# Patient Record
Sex: Male | Born: 1945 | Race: White | Hispanic: No | Marital: Married | Smoking: Former smoker
Health system: Southern US, Community
[De-identification: ages and names within clinical notes are randomized; demographics above are authoritative.]

## PROBLEM LIST (undated history)

## (undated) DIAGNOSIS — I2510 Atherosclerotic heart disease of native coronary artery without angina pectoris: Principal | ICD-10-CM

## (undated) DIAGNOSIS — I10 Essential (primary) hypertension: Secondary | ICD-10-CM

## (undated) DIAGNOSIS — E785 Hyperlipidemia, unspecified: Secondary | ICD-10-CM

## (undated) DIAGNOSIS — R0602 Shortness of breath: Secondary | ICD-10-CM

## (undated) DIAGNOSIS — R479 Unspecified speech disturbances: Secondary | ICD-10-CM

## (undated) DIAGNOSIS — M6282 Rhabdomyolysis: Secondary | ICD-10-CM

## (undated) DIAGNOSIS — M720 Palmar fascial fibromatosis [Dupuytren]: Secondary | ICD-10-CM

## (undated) HISTORY — PX: COLONOSCOPY: SHX174

## (undated) HISTORY — PX: TONSILLECTOMY: SHX5217

## (undated) HISTORY — DX: Essential (primary) hypertension: I10

## (undated) HISTORY — DX: Hyperlipidemia, unspecified: E78.5

## (undated) HISTORY — DX: Atherosclerotic heart disease of native coronary artery without angina pectoris: I25.10

## (undated) HISTORY — PX: HAND SURGERY: SHX662

## (undated) HISTORY — DX: Palmar fascial fibromatosis (dupuytren): M72.0

## (undated) HISTORY — DX: Unspecified speech disturbances: R47.9

---

## 2007-08-08 ENCOUNTER — Ambulatory Visit: Payer: Self-pay | Admitting: Internal Medicine

## 2007-08-08 LAB — CONVERTED CEMR LAB
ALT: 33 units/L (ref 0–53)
Albumin: 4.1 g/dL (ref 3.5–5.2)
Basophils Absolute: 0 10*3/uL (ref 0.0–0.1)
Bilirubin Urine: NEGATIVE
Bilirubin, Direct: 0.2 mg/dL (ref 0.0–0.3)
Calcium: 9.6 mg/dL (ref 8.4–10.5)
Eosinophils Absolute: 0.7 10*3/uL — ABNORMAL HIGH (ref 0.0–0.6)
Eosinophils Relative: 10.3 % — ABNORMAL HIGH (ref 0.0–5.0)
GFR calc Af Amer: 87 mL/min
GFR calc non Af Amer: 72 mL/min
Glucose, Bld: 100 mg/dL — ABNORMAL HIGH (ref 70–99)
HDL: 60.1 mg/dL (ref >39.0)
Hemoglobin, Urine: NEGATIVE
Ketones, ur: NEGATIVE mg/dL
Lymphocytes Relative: 25 % (ref 12.0–46.0)
MCHC: 35.4 g/dL (ref 30.0–36.0)
MCV: 100.7 fL — ABNORMAL HIGH (ref 78.0–100.0)
Monocytes Relative: 8.8 % (ref 3.0–11.0)
Neutro Abs: 3.9 10*3/uL (ref 1.4–7.7)
PSA: 0.36 ng/mL (ref 0.10–4.00)
Platelets: 243 10*3/uL (ref 150–400)
RBC: 4.41 M/uL (ref 4.22–5.81)
TSH: 1.97 microintl units/mL (ref 0.35–5.50)
Total CHOL/HDL Ratio: 4
Triglycerides: 102 mg/dL (ref 0–149)
Urine Glucose: NEGATIVE mg/dL
WBC: 7 10*3/uL (ref 4.5–10.5)

## 2007-08-11 ENCOUNTER — Ambulatory Visit: Payer: Self-pay | Admitting: Internal Medicine

## 2007-09-05 ENCOUNTER — Ambulatory Visit: Payer: Self-pay | Admitting: Internal Medicine

## 2007-09-19 ENCOUNTER — Encounter: Payer: Self-pay | Admitting: Internal Medicine

## 2007-09-19 ENCOUNTER — Ambulatory Visit: Payer: Self-pay | Admitting: Internal Medicine

## 2007-09-19 LAB — HM COLONOSCOPY: HM Colonoscopy: ABNORMAL

## 2007-10-06 ENCOUNTER — Ambulatory Visit: Payer: Self-pay | Admitting: Internal Medicine

## 2007-10-06 DIAGNOSIS — R7309 Other abnormal glucose: Secondary | ICD-10-CM

## 2007-10-06 LAB — CONVERTED CEMR LAB
ALT: 39 units/L (ref 0–53)
AST: 27 units/L (ref 0–37)
Bilirubin, Direct: 0.1 mg/dL (ref 0.0–0.3)
Cholesterol: 216 mg/dL (ref 0–200)
HDL: 45 mg/dL (ref >39.0)
Total CHOL/HDL Ratio: 4.8
Total Protein: 7.7 g/dL (ref 6.0–8.3)

## 2007-10-12 ENCOUNTER — Ambulatory Visit: Payer: Self-pay | Admitting: Internal Medicine

## 2007-10-12 DIAGNOSIS — I10 Essential (primary) hypertension: Secondary | ICD-10-CM | POA: Insufficient documentation

## 2007-10-12 DIAGNOSIS — E785 Hyperlipidemia, unspecified: Secondary | ICD-10-CM

## 2007-12-05 ENCOUNTER — Ambulatory Visit: Payer: Self-pay | Admitting: Internal Medicine

## 2007-12-05 LAB — CONVERTED CEMR LAB
ALT: 31 units/L (ref 0–53)
AST: 22 units/L (ref 0–37)
LDL Cholesterol: 82 mg/dL (ref 0–99)
VLDL: 16 mg/dL (ref 0–40)

## 2007-12-06 ENCOUNTER — Encounter: Payer: Self-pay | Admitting: Internal Medicine

## 2007-12-09 ENCOUNTER — Ambulatory Visit: Payer: Self-pay | Admitting: Internal Medicine

## 2007-12-09 DIAGNOSIS — L259 Unspecified contact dermatitis, unspecified cause: Secondary | ICD-10-CM | POA: Insufficient documentation

## 2007-12-22 ENCOUNTER — Telehealth: Payer: Self-pay | Admitting: Internal Medicine

## 2008-01-10 ENCOUNTER — Telehealth: Payer: Self-pay | Admitting: Internal Medicine

## 2008-02-06 ENCOUNTER — Ambulatory Visit: Payer: Self-pay | Admitting: Internal Medicine

## 2008-02-07 ENCOUNTER — Encounter: Payer: Self-pay | Admitting: Internal Medicine

## 2008-04-10 ENCOUNTER — Ambulatory Visit: Payer: Self-pay | Admitting: Internal Medicine

## 2008-08-14 ENCOUNTER — Telehealth: Payer: Self-pay | Admitting: Internal Medicine

## 2008-08-15 ENCOUNTER — Ambulatory Visit: Payer: Self-pay | Admitting: Internal Medicine

## 2008-08-15 DIAGNOSIS — J309 Allergic rhinitis, unspecified: Secondary | ICD-10-CM | POA: Insufficient documentation

## 2008-08-15 DIAGNOSIS — H9319 Tinnitus, unspecified ear: Secondary | ICD-10-CM | POA: Insufficient documentation

## 2008-10-04 ENCOUNTER — Ambulatory Visit: Payer: Self-pay | Admitting: Internal Medicine

## 2008-10-04 LAB — CONVERTED CEMR LAB
ALT: 31 units/L (ref 0–53)
AST: 26 units/L (ref 0–37)
CO2: 28 meq/L (ref 19–32)
Chloride: 109 meq/L (ref 96–112)
Potassium: 4.5 meq/L (ref 3.5–5.1)
Sodium: 143 meq/L (ref 135–145)
Total CHOL/HDL Ratio: 2.4

## 2008-10-09 ENCOUNTER — Ambulatory Visit: Payer: Self-pay | Admitting: Internal Medicine

## 2008-10-09 DIAGNOSIS — E669 Obesity, unspecified: Secondary | ICD-10-CM

## 2009-02-07 ENCOUNTER — Ambulatory Visit: Payer: Self-pay | Admitting: Internal Medicine

## 2009-02-07 DIAGNOSIS — M720 Palmar fascial fibromatosis [Dupuytren]: Secondary | ICD-10-CM

## 2009-04-04 ENCOUNTER — Encounter: Payer: Self-pay | Admitting: Internal Medicine

## 2009-05-01 ENCOUNTER — Telehealth: Payer: Self-pay | Admitting: Internal Medicine

## 2009-05-14 ENCOUNTER — Telehealth: Payer: Self-pay | Admitting: Internal Medicine

## 2009-07-23 ENCOUNTER — Ambulatory Visit: Payer: Self-pay | Admitting: Internal Medicine

## 2009-07-23 LAB — CONVERTED CEMR LAB
BUN: 14 mg/dL (ref 6–23)
GFR calc non Af Amer: 79.62 mL/min (ref >60)
Potassium: 4.4 meq/L (ref 3.5–5.1)
Sodium: 142 meq/L (ref 135–145)

## 2009-08-30 ENCOUNTER — Ambulatory Visit: Payer: Self-pay | Admitting: Internal Medicine

## 2009-09-06 ENCOUNTER — Ambulatory Visit: Payer: Self-pay | Admitting: Internal Medicine

## 2009-10-10 ENCOUNTER — Ambulatory Visit: Payer: Self-pay | Admitting: Internal Medicine

## 2009-10-10 DIAGNOSIS — L03019 Cellulitis of unspecified finger: Secondary | ICD-10-CM

## 2010-01-22 ENCOUNTER — Telehealth: Payer: Self-pay | Admitting: Internal Medicine

## 2010-03-05 ENCOUNTER — Encounter: Payer: Self-pay | Admitting: Internal Medicine

## 2010-03-06 ENCOUNTER — Telehealth: Payer: Self-pay | Admitting: Internal Medicine

## 2010-03-24 ENCOUNTER — Telehealth: Payer: Self-pay | Admitting: Internal Medicine

## 2010-06-03 ENCOUNTER — Telehealth: Payer: Self-pay | Admitting: Internal Medicine

## 2010-06-03 DIAGNOSIS — M79609 Pain in unspecified limb: Secondary | ICD-10-CM | POA: Insufficient documentation

## 2010-08-21 LAB — CONVERTED CEMR LAB
ALT: 46 units/L (ref 0–53)
AST: 38 units/L — ABNORMAL HIGH (ref 0–37)
Albumin: 4 g/dL (ref 3.5–5.2)
Alkaline Phosphatase: 42 units/L (ref 39–117)
BUN: 14 mg/dL (ref 6–23)
CO2: 26 meq/L (ref 19–32)
Chloride: 101 meq/L (ref 96–112)
Cholesterol: 178 mg/dL (ref 0–200)
GFR calc non Af Amer: 83.18 mL/min (ref >60.00)
Glucose, Bld: 89 mg/dL (ref 70–99)
PSA: 0.37 ng/mL (ref 0.10–4.00)
Potassium: 4.3 meq/L (ref 3.5–5.1)
Sodium: 136 meq/L (ref 135–145)
TSH: 2.4 microintl units/mL (ref 0.35–5.50)
Total Protein: 7.5 g/dL (ref 6.0–8.3)
VLDL: 12.2 mg/dL (ref 0.0–40.0)

## 2010-08-22 ENCOUNTER — Ambulatory Visit: Payer: Self-pay | Admitting: Internal Medicine

## 2010-08-29 ENCOUNTER — Encounter: Payer: Self-pay | Admitting: Internal Medicine

## 2010-08-29 ENCOUNTER — Ambulatory Visit
Admission: RE | Admit: 2010-08-29 | Discharge: 2010-08-29 | Payer: Self-pay | Source: Home / Self Care | Attending: Internal Medicine | Admitting: Internal Medicine

## 2010-09-01 ENCOUNTER — Telehealth: Payer: Self-pay | Admitting: Internal Medicine

## 2010-09-01 DIAGNOSIS — J329 Chronic sinusitis, unspecified: Secondary | ICD-10-CM | POA: Insufficient documentation

## 2010-09-23 NOTE — Progress Notes (Signed)
Summary: Simvastatin dose update  Phone Note From Pharmacy   [facility]  [address] [phone]* Summary of Call: Requesting a new rx with updated dosage Initial call taken by: Glendell Docker CMA,  March 24, 2010 11:39 AM    New/Updated Medications: SIMVASTATIN 20 MG TABS (SIMVASTATIN) Take 1 tablet by mouth once a day Prescriptions: SIMVASTATIN 20 MG TABS (SIMVASTATIN) Take 1 tablet by mouth once a day  #30 x 2   Entered by:   Glendell Docker CMA   Authorized by:   D. Thomos Lemons DO   Signed by:   Glendell Docker CMA on 03/24/2010   Method used:   [facility]        [facility]  [address] [phone]* (retail)       [address]       [place], [place]  [address]       Ph: [phone] or [phone]       Fax: [phone]   RxID:   [id number]

## 2010-09-23 NOTE — Progress Notes (Signed)
Summary: Medication Change  Phone Note Outgoing Call   Summary of Call: call pt - advise him to take 1/2 tab of simvastatin 40 mg Initial call taken by: D. Thomos Lemons DO,  March 06, 2010 5:24 PM  Follow-up for Phone Call        attempted to contact patient at [phone], no answer. A detailed voice message was left informing patient per Dr Artist Pais instructions. He was advised to call back with any questions Follow-up by: Glendell Docker CMA,  March 07, 2010 8:30 AM    New/Updated Medications: SIMVASTATIN 40 MG  TABS (SIMVASTATIN) one half tab by mouth once daily

## 2010-09-23 NOTE — Progress Notes (Signed)
Summary: Foot Dr Referral  Phone Note Call from Patient Call back at Work Phone [phone]   Caller: Patient Call For: D. Thomos Lemons DO Reason for Call: Referral Summary of Call: patient called and left voice message stating he  has developed a growth on his foot that is now causing him pain. He would like to know if there is a foot doctor that Dr Artist Pais could refer him to for evaluation Initial call taken by: Glendell Docker CMA,  June 03, 2010 4:30 PM  Follow-up for Phone Call        does he mind traveling to [place]? Follow-up by: D. Thomos Lemons DO,  June 03, 2010 5:24 PM  Additional Follow-up for Phone Call Additional follow up Details #1::        call placed to patient at [phone], he states he does not mind traveling to [place], because that is where he works. He states that he will be out of town the week of the 24th. He would like the appointment scheduled before or after that week. Additional Follow-up by: Glendell Docker CMA,  June 04, 2010 11:13 AM  New Problems: FOOT PAIN (ICD-729.5)   Additional Follow-up for Phone Call Additional follow up Details #2::    Patient scheduled   [facility]   Dr  Ralene Cork   Oct  18   pt notified Follow-up by: Darral Dash,  June 05, 2010 3:28 PM  New Problems: FOOT PAIN (ICD-729.5)

## 2010-09-23 NOTE — Progress Notes (Signed)
Summary: triamcinolone refill  Phone Note Call from Patient Call back at [phone]   Caller: Patient Summary of Call: Pt left message requesting refill on Triamcinolone cream. Pt last seen 2/11 and has f/u 08/29/10.   Mervin Kung CMA  January 22, 2010 11:42 AM   Follow-up for Phone Call        Left message on pt's voicemail that refill was completed and to call with any questions.  Nicki Guadalajara Fergerson CMA  January 22, 2010 11:45 AM     Prescriptions: TRIAMCINOLONE ACETONIDE 0.1 %  CREA (TRIAMCINOLONE ACETONIDE) apply bid  #30 grams x 3   Entered by:   Mervin Kung CMA   Authorized by:   D. Thomos Lemons DO   Signed by:   Mervin Kung CMA on 01/22/2010   Method used:   [facility]        [facility]  [address] [phone]* (retail)       [address]       [place], [place]  [address]       Ph: [phone] or [phone]       Fax: [phone]   RxID:   [id number]

## 2010-09-23 NOTE — Assessment & Plan Note (Signed)
Summary: thumb infected [place]   Vital Signs:  Patient profile:   65 year old male Weight:      251 pounds BMI:     31.07 O2 Sat:      96 % on Room air Temp:     98.3 degrees F oral Pulse rate:   70 / minute Pulse rhythm:   regular Resp:     18 per minute BP sitting:   100 / 70  (right arm) Cuff size:   large  Vitals Entered By: Glendell Docker CMA (October 10, 2009 3:48 PM)  O2 Flow:  Room air  Primary Care Provider:  D. Thomos Lemons DO  CC:  Infected left thumb.  History of Present Illness: 65 y/o c/o painful left thumb since Monday.he had a loose  piece of skin and clipped it with the nail clipper. Neosporin and bandage with  no improvement.  mild throbbing.  mild drainage.  no fever  Allergies: 1)  Ace Inhibitors  Past History:  Past Medical History: Colon polyp Hypertension Hyperlipidemia  Colonoscopy-September 19, 2007 (colon polyps x 3.  Internal hemorrhoids)   A descending colon polyp - adenomatous polyp.  No high grade dysplasia or invasive malignancy identified. 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   Skin:  left thumb nail bed red, tender and swollen.  not firm,  no fluctuance.  mild drainage   Impression & Recommendations:  Problem # 1:  PARONYCHIA, FINGER (ICD-681.02) Left thumb paronychia.  some driange already.  We can tx with abx and local care.  If symptoms get worse, he  understands we may need I & D His updated medication list for this problem includes:    Augmentin 875-125 Mg Tabs (Amoxicillin-pot clavulanate) ..... One by mouth bid  Complete Medication List: 1)  Benicar 20 Mg Tabs (Olmesartan medoxomil) .... Take 1 tablet by mouth once a day 2)  Simvastatin 40 Mg Tabs (Simvastatin) .... One by mouth once daily 3)  Triamcinolone Acetonide 0.1 % Crea (Triamcinolone acetonide) .... Apply bid 4)  Amlodipine Besylate 5 Mg Tabs (Amlodipine besylate) .... One by mouth qd 5)  Zostavax 16109 Unt/0.54ml Solr (Zoster vaccine live) .... Administer vaccine x 1 6)  Augmentin 875-125 Mg Tabs (Amoxicillin-pot clavulanate) .... One by mouth bid  Patient Instructions: 1)  Soak thumb in warm salt solution two times a day 2)  Call our office if your symptoms do not  improve or gets worse. Prescriptions: AUGMENTIN 875-125 MG TABS (AMOXICILLIN-POT CLAVULANATE) one by mouth bid  #14 x 0   Entered and Authorized by:   D. Thomos Lemons DO   Signed by:   D. Thomos Lemons DO on 10/10/2009   Method used:   [facility]ically to        [facility] [phone]* (retail)       [address]       [place],  [place]  [address]       Ph: [phone] or [phone]       Fax: [phone]   RxID:   [id number]   Current Allergies (reviewed today): ACE INHIBITORS

## 2010-09-23 NOTE — Medication Information (Signed)
Summary: Interaction with [facility]ipine & Simvastatin/[facility]  Interaction with [facility]ine & [facility]vastatin/[facility]   Imported By: Lanelle Bal 03/13/2010 07:53:26  _____________________________________________________________________  External Attachment:    Type:   Image     Comment:   External Document

## 2010-09-23 NOTE — Assessment & Plan Note (Signed)
Summary: pneumonia inj/mhf  Nurse Visit   Allergies: 1)  Ace Inhibitors  Immunizations Administered:  Pneumonia Vaccine:    Vaccine Type: Pneumovax    Site: left deltoid    Mfr: Merck    Dose: 0.5 ml    Route: IM    Given by: Glendell Docker CMA    Exp. Date: 08/08/2010    Lot #: [id number]    VIS given: 06/01/2008  Orders Added: 1)  Pneumococcal Vaccine [90732] 2)  Admin 1st Vaccine [60454]

## 2010-09-23 NOTE — Assessment & Plan Note (Signed)
Summary: FOLLOW UP/[facility]   Vital Signs:  Patient profile:   65 year old male Height:      75.5 inches Weight:      249.50 pounds BMI:     30.88 O2 Sat:      97 % on Room air Temp:     98.2 degrees F oral Pulse rate:   67 / minute Pulse rhythm:   regular Resp:     18 per minute BP sitting:   112 / 80  (right arm) Cuff size:   large  Vitals Entered By: Glendell Docker CMA (August 30, 2009 3:26 PM)  O2 Flow:  Room air  Primary Care Provider:  D. Thomos Lemons DO  CC:  Follow up disease management.  History of Present Illness: Follow up disease management  Hypertension Follow-Up      This is a 65 year old man who presents for Hypertension follow-up.  The patient denies lightheadedness, urinary frequency, and headaches.  The patient denies the following associated symptoms: chest pain.  Compliance with medications (by patient report) has been near 100%.  The patient reports that dietary compliance has been fair.  The patient reports exercising occasionally.    Hyperlipidemia Follow-Up      The patient also presents for Hyperlipidemia follow-up.  The patient denies muscle aches and abdominal pain.  The patient denies the following symptoms: chest pain/pressure.  Compliance with medications (by patient report) has been near 100%.  Dietary compliance has been fair.    Preventive Screening-Counseling & Management  Alcohol-Tobacco     Smoking Status: quit  Allergies: 1)  Ace Inhibitors  Past History:  Past Medical History: Colon polyp Hypertension Hyperlipidemia  Colonoscopy-September 19, 2007 (colon polyps x 3.  Internal hemorrhoids)   A descending colon polyp - adenomatous polyp.  No high grade dysplasia or invasive malignancy identified. 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   Neck:  supple and no masses.   Lungs:  normal respiratory effort and normal breath sounds.   Heart:  normal rate, regular rhythm, and no gallop.   Abdomen:  soft and non-tender.   Extremities:  No lower extremity edema  Neurologic:  cranial nerves II-XII intact and gait normal.     Impression & Recommendations:  Problem # 1:  HYPERTENSION (ICD-401.9) well controlled.  Maintain current medication regimen.  His updated medication list for this problem includes:    Benicar 20 Mg Tabs (Olmesartan medoxomil) .Marland Kitchen... Take 1 tablet by mouth once a day    Amlodipine Besylate 5 Mg Tabs (Amlodipine besylate) ..... One by mouth qd  BP today: 112/80 Prior BP: 120/60 (02/07/2009)  Labs Reviewed: K+: 4.4 (07/23/2009) Creat: : 1.0 (07/23/2009)   Chol: 135 (10/04/2008)   HDL: 56.4 (10/04/2008)   LDL: 68 (10/04/2008)   TG: 53 (10/04/2008)  Problem # 2:  HYPERLIPIDEMIA (ICD-272.4) stable.  Maintain current medication regimen.  His updated medication list  for this problem includes:    Simvastatin 40 Mg Tabs (Simvastatin) ..... One by mouth once daily  Labs Reviewed: SGOT: 26 (10/04/2008)   SGPT: 31 (10/04/2008)   HDL:56.4 (10/04/2008), 52.8 (12/05/2007)  LDL:68 (10/04/2008), 82 (12/05/2007)  Chol:135 (10/04/2008), 151 (12/05/2007)  Trig:53 (10/04/2008), 82 (12/05/2007)  Complete Medication List: 1)  Benicar 20 Mg Tabs (Olmesartan medoxomil) .... Take 1 tablet by mouth once a day 2)  Simvastatin 40 Mg Tabs (Simvastatin) .... One by mouth once daily 3)  Triamcinolone Acetonide 0.1 % Crea (Triamcinolone  acetonide) .... Apply bid 4)  Amlodipine Besylate 5 Mg Tabs (Amlodipine besylate) .... One by mouth qd 5)  Zostavax 16109 Unt/0.74ml Solr (Zoster vaccine live) .... Administer vaccine x 1  Patient Instructions: 1)  Please schedule a follow-up appointment in 1 year. 2)  BMP prior to visit, ICD-9:  401.9 3)  Hepatic Panel prior to visit, ICD-9: 272.4 4)  Lipid Panel prior to visit, ICD-9: 272.4 5)  TSH prior to visit, ICD-9: 272.4 6)  PSA:  v76.44 7)  Please return for lab work one (1) week before your next appointment.  Prescriptions: ZOSTAVAX 60454 UNT/0.65ML SOLR (ZOSTER VACCINE LIVE) administer vaccine x 1  #1 x 0   Entered and Authorized by:   D. Thomos Lemons DO   Signed by:   D. Thomos Lemons DO on 08/30/2009   Method used:   Print then Give to Patient   RxID:   [id number] AMLODIPINE BESYLATE 5 MG  TABS (AMLODIPINE BESYLATE) one by mouth qd  #90 x 3   Entered and Authorized by:   D. Thomos Lemons DO   Signed by:   D. Thomos Lemons DO on 08/30/2009   Method used:   [facility]ronically to        [facility] [phone]* (retail)       [address]       [place], [place]  [address]       Ph: [phone] or [phone]       Fax: [phone]   RxID:   [id number] SIMVASTATIN 40 MG  TABS (SIMVASTATIN) one by mouth once daily  #90 x 3   Entered and Authorized by:   D. Thomos Lemons DO   Signed by:   D. Thomos Lemons DO on 08/30/2009   Method used:   [facility]ronically to        [facility] #[id number]* (retail)       [address]       [place], [place]  [address]       Ph: [phone] or [phone]       Fax: [phone]   RxID:   [id number] BENICAR 20 MG  TABS (OLMESARTAN MEDOXOMIL) Take 1 tablet by mouth once a day  #90 x 3   Entered and Authorized by:   D. Thomos Lemons DO   Signed by:   D. Thomos Lemons DO on 08/30/2009   Method used:   [facility]ronically to        [facility] [phone]* (retail)       [address]       [place], [place]  [address]       Ph: [phone] or  [phone]       Fax: [phone]   RxID:   [id number]   Current Allergies (reviewed today): ACE INHIBITORS

## 2010-09-25 NOTE — Assessment & Plan Note (Signed)
Summary: 1 year follow up/mhf   Vital Signs:  Patient profile:   65 year old male Height:      75.5 inches Weight:      253.75 pounds BMI:     31.41 O2 Sat:      95 % on Room air Temp:     98.6 degrees F oral Pulse rate:   66 / minute Resp:     18 per minute BP sitting:   110 / 60  (left arm) Cuff size:   large  Vitals Entered By: Glendell Docker CMA (August 29, 2010 3:03 PM)  O2 Flow:  Room air   Primary Care Provider:  D. Thomos Lemons DO   History of Present Illness:  65 y/o white male with hx of htn and hyperlipidemia for routine cpx   FLU VAX          Every 12 months         07/12/2007  given      Due Now  PNEUMOVAX        At Age 13 years         09/06/2009  Pneumovax  Done  TD BOOSTER       Every 10 years          12/04/2002  given      Due On: 12/03/2012  ZOSTAVAX         At Age 57 years                                Due Now  PSA              Every 12 months         08/21/2010  0.37       Due On: 08/22/2011   or PSA OFFERED                                        COLONOSCOPY      Every 10 years          09/19/2007  abnormal   Due On: 09/18/2017  COLONNXTDUE      At Age 62 years                                Due Now  FLEX SIGMOID     Every 5 years                                  Due Now  HEMOCCULT        Every 12 months                                Due Now  CHOLESTEROL      Every 5 years           08/21/2010  178        Due On: 08/22/2015  Preventive Screening-Counseling & Management  Alcohol-Tobacco     Alcohol drinks/day: 1     Smoking Status: quit  Caffeine-Diet-Exercise     Caffeine use/day: 2 beverages daily     Does Patient Exercise: no  Allergies: 1)  Ace Inhibitors  Past History:  Past Medical History: Colon polyp Hypertension Hyperlipidemia    Colonoscopy-September 19, 2007 (colon polyps x 3.  Internal hemorrhoids)   A descending colon polyp - adenomatous polyp.  No high grade dysplasia or invasive malignancy identified. 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    Physical Exam  General:  alert, well-developed, and well-nourished.   Head:  normocephalic and atraumatic.   Mouth:  good dentition and pharynx pink and moist.   Neck:  supple and no masses.  no carotid bruits.   Lungs:  normal respiratory effort and normal breath sounds.   Heart:  normal rate, regular rhythm, and no gallop.   Extremities:  No lower extremity edema  Neurologic:  cranial nerves II-XII intact and gait normal.     Impression & Recommendations:  Problem # 1:  ROUTINE GENERAL MEDICAL EXAM@HEALTH  CARE FACL (ICD-V70.0) Reviewed adult health maintenance protocols. Pt counseled on diet and exercise.  Orders: EKG w/ Interpretation (93000)  Colonoscopy: abnormal (09/19/2007) Td Booster: given (12/04/2002)   Flu Vax: Historical (07/08/2010)   Pneumovax: Pneumovax (09/06/2009) Chol: 178 (08/21/2010)   HDL: 68.90 (08/21/2010)   LDL: 97 (08/21/2010)   TG: 61.0 (08/21/2010) TSH: 2.40 (08/21/2010)   HgbA1C: 5.3 (10/06/2007)   PSA: 0.37 (08/21/2010)  Problem # 2:  HYPERTENSION (ICD-401.9) Assessment: Unchanged  His updated medication list for this problem includes:    Benicar 20 Mg Tabs (Olmesartan medoxomil)  .Marland Kitchen... Take 1 tablet by mouth once a day    Amlodipine Besylate 5 Mg Tabs (Amlodipine besylate) ..... One by mouth qd  BP today: 110/60 Prior BP: 100/70 (10/10/2009)  Labs Reviewed: K+: 4.3 (08/21/2010) Creat: : 1.0 (08/21/2010)   Chol: 178 (08/21/2010)   HDL: 68.90 (08/21/2010)   LDL: 97 (08/21/2010)   TG: 61.0 (08/21/2010)  Problem # 3:  HYPERLIPIDEMIA (ICD-272.4) Assessment: Unchanged  His updated medication list for this problem includes:    Simvastatin 20 Mg Tabs (Simvastatin) .Marland Kitchen... Take 1 tablet by mouth once a day  Labs Reviewed: SGOT: 38 (08/21/2010)   SGPT: 46 (08/21/2010)   HDL:68.90 (08/21/2010), 56.4 (10/04/2008)  LDL:97 (08/21/2010), 68 (10/04/2008)  Chol:178 (08/21/2010), 135 (10/04/2008)  Trig:61.0 (08/21/2010), 53 (10/04/2008)  Complete Medication List: 1)  Benicar 20 Mg Tabs (Olmesartan medoxomil) .... Take 1 tablet by mouth once a day 2)  Simvastatin 20 Mg Tabs (Simvastatin) .... Take 1 tablet by mouth once a day 3)  Amlodipine Besylate 5 Mg Tabs (Amlodipine besylate) .... One by mouth qd 4)  Zostavax 11914 Unt/0.53ml Solr (Zoster vaccine live) .... Administer vaccine x 1 5)  Clotrimazole-betamethasone 1-0.05 % Crea (Clotrimazole-betamethasone) .... Apply two times a day x 1-2 weeks  Patient Instructions: 1)  Please  schedule a follow-up appointment in 1 year. Prescriptions: AMLODIPINE BESYLATE 5 MG  TABS (AMLODIPINE BESYLATE) one by mouth qd  #90 x 3   Entered and Authorized by:   D. Thomos Lemons DO   Signed by:   D. Thomos Lemons DO on 08/29/2010   Method used:   [facility] to        [facility] [phone]* (retail)       [address]       [place], [place]  [address]       Ph: [phone] or [phone]       Fax: [phone]   RxID:   [id number] SIMVASTATIN 20 MG TABS (SIMVASTATIN) Take 1 tablet by mouth once a day  #90 x 3   Entered and Authorized by:   D. Thomos Lemons DO   Signed by:   D. Thomos Lemons DO on 08/29/2010   Method used:    [facility]ronically to        [facility] #[id number] (retail)       [address]       [place], [place]  [address]       Ph: [phone] or [phone]       Fax: [phone]   RxID:   [id number] BENICAR 20 MG  TABS (OLMESARTAN MEDOXOMIL) Take 1 tablet by mouth once a day  #90 x 3   Entered and Authorized by:   D. Thomos Lemons DO   Signed by:   D. Thomos Lemons DO on 08/29/2010   Method used:   [facility]ronically to        [facility] [phone]* (retail)       [address]       [place], [place]  [address]       Ph: [phone] or [phone]       Fax: [phone]   RxID:   [id number]    Orders Added: 1)  EKG w/ Interpretation [93000] 2)  Est. Patient age 32&> [93810] 3)  Est. Patient Level II [17510]   Immunization History:  Influenza Immunization History:    Influenza:  historical (07/08/2010)   Immunization History:  Influenza Immunization History:    Influenza:  Historical (07/08/2010)   Current Allergies (reviewed today): ACE INHIBITORS

## 2010-09-25 NOTE — Progress Notes (Signed)
Summary: Dry patch & ENT  Phone Note Call from Patient Call back at Work Phone [phone]   Caller: Patient Call For: D. Thomos Lemons DO Summary of Call: patient called and left voice message stating after his office visit last week with Dr Artist Pais, he  states that he found a dry scaly patch underneath the his right leg where his socks are. He aslo wanted to know if Dr Artist Pais would provide him a referral or a name of ENT to manage his chronic  sinus problems Initial call taken by: Glendell Docker CMA,  September 01, 2010 11:55 AM  Follow-up for Phone Call        use lotrisone crm as directed if dry patch gets worse, needs OV  can you please ask pt to elaborate re:  sinus problems Follow-up by: D. Thomos Lemons DO,  September 01, 2010 5:22 PM  Additional Follow-up for Phone Call Additional follow up Details #1::        call placed to patient he has been informed per Dr Artist Pais instructions  When asked about his sinus problems, he states onset of problems at age 40 severe sinus infections, had shot of cortisone, did not have any problesm for about 10-12 years.  Move to [place]- seen a ENT there , had problems  and moved to [place] seen ENT diagnosed with sinusitis. He state that his sinuses are giving him problems. He is experiencing severe sinus problems. He feels like has become and expert regarding his sinuses.  Additional Follow-up by: Glendell Docker CMA,  September 02, 2010 9:36 AM  New Problems: SINUSITIS, CHRONIC (ICD-473.9)   Additional Follow-up for Phone Call Additional follow up Details #2::    call returned to patient at [phone], he has been advised of ENT referral Follow-up by: Glendell Docker CMA,  September 03, 2010 8:38 AM  New Problems: SINUSITIS, CHRONIC (ICD-473.9) New/Updated Medications: CLOTRIMAZOLE-BETAMETHASONE 1-0.05 % CREA (CLOTRIMAZOLE-BETAMETHASONE) apply two times a day x 1-2 weeks Prescriptions: CLOTRIMAZOLE-BETAMETHASONE 1-0.05 % CREA (CLOTRIMAZOLE-BETAMETHASONE) apply two  times a day x 1-2 weeks  #30 grams x 1   Entered and Authorized by:   D. Thomos Lemons DO   Signed by:   D. Thomos Lemons DO on 09/01/2010   Method used:   [facility] to        [facility] [phone]* (retail)       [address]       [place], [place]  [address]       Ph: [phone] or [phone]       Fax: [phone]   RxID:   [id number]

## 2011-03-19 ENCOUNTER — Encounter: Payer: Self-pay | Admitting: Internal Medicine

## 2011-03-19 ENCOUNTER — Ambulatory Visit (INDEPENDENT_AMBULATORY_CARE_PROVIDER_SITE_OTHER): Payer: 59 | Admitting: Internal Medicine

## 2011-03-19 VITALS — BP 120/70 | HR 72 | Temp 98.9°F | Resp 18 | Ht 75.5 in | Wt 254.0 lb

## 2011-03-19 DIAGNOSIS — R21 Rash and other nonspecific skin eruption: Secondary | ICD-10-CM

## 2011-03-19 MED ORDER — TRIAMCINOLONE ACETONIDE 0.025 % EX OINT: TOPICAL_OINTMENT | Freq: Two times a day (BID) | CUTANEOUS | Status: DC

## 2011-03-20 ENCOUNTER — Telehealth: Payer: Self-pay | Admitting: *Deleted

## 2011-03-20 NOTE — Telephone Encounter (Signed)
Call placed to patient at [phone], no answer. A detailed voice message was left informing patient per Dr Rodena Medin instruction. Message was left advising patient to call back if no improvement after trying standard dose.

## 2011-03-20 NOTE — Telephone Encounter (Signed)
Patient called and left voice message stating he received a rx for Triamcinolone cream 0.025 % and in the past was given a rx for  0.1%. He would like to know if the lower dose cream is going to take care of his skin irritation, or if he should get the rx for the higher dose. Please advise

## 2011-03-20 NOTE — Telephone Encounter (Signed)
Try the standard dose first.

## 2011-03-21 DIAGNOSIS — R21 Rash and other nonspecific skin eruption: Secondary | ICD-10-CM | POA: Insufficient documentation

## 2011-03-21 NOTE — Assessment & Plan Note (Signed)
Begin triamcinolone cream to affected area twice a day. Followup if no improvement or worsening

## 2011-03-21 NOTE — Progress Notes (Signed)
  Subjective:    Patient ID: Austin Keller, male    DOB: Feb 09, 1946, 65 y.o.   MRN: [id number]  HPI patient presents to clinic for evaluation of rash. Meds four-day history of right shoulder rash without pain or dermatomal distribution. Involved right upper neck and right upper chest. No specific trigger and denies itching. There is no spread. No ocular or oral involvement. Has attempted approximately 36 hours of Lotrisone cream without improvement. No other complaints.  Reviewed past medical history, medications, and allergies  Review of Systems see hpi     Objective:   Physical Exam  Nursing note and vitals reviewed. Constitutional: He appears well-developed and well-nourished. No distress.  HENT:  Head: Normocephalic and atraumatic.  Right Ear: External ear normal.  Left Ear: External ear normal.  Eyes: Conjunctivae are normal. No scleral icterus.  Neurological: He is alert.  Skin: Skin is warm and dry. He is not diaphoretic.       Erythematous vesicular papular rash involving right upper neck and right upper chest. No drainage. No obvious secondary infection. No dermatomal distribution.  Psychiatric: He has a normal mood and affect.          Assessment & Plan:   No problem-specific assessment & plan notes found for this encounter.

## 2011-03-25 ENCOUNTER — Other Ambulatory Visit: Payer: Self-pay | Admitting: Internal Medicine

## 2011-03-25 ENCOUNTER — Telehealth: Payer: Self-pay | Admitting: *Deleted

## 2011-03-25 DIAGNOSIS — R21 Rash and other nonspecific skin eruption: Secondary | ICD-10-CM

## 2011-03-25 NOTE — Telephone Encounter (Signed)
Patient called and left voice message stating he was seen last week for a rash. His message states there has been little improvement in the rash. He would llike to know if he could be referred to a dermatologist for evaluation.

## 2011-03-25 NOTE — Telephone Encounter (Signed)
Referral order placed.

## 2011-03-25 NOTE — Telephone Encounter (Signed)
Call placed [phone], he was informed per Dr. Rodena Medin instruction

## 2011-08-21 ENCOUNTER — Other Ambulatory Visit: Payer: 59

## 2011-08-21 ENCOUNTER — Ambulatory Visit: Payer: 59

## 2011-08-21 DIAGNOSIS — Z0389 Encounter for observation for other suspected diseases and conditions ruled out: Secondary | ICD-10-CM

## 2011-08-21 DIAGNOSIS — Z0000 Encounter for general adult medical examination without abnormal findings: Secondary | ICD-10-CM

## 2011-08-21 LAB — URINALYSIS
Hgb urine dipstick: NEGATIVE
Nitrite: NEGATIVE
Urobilinogen, UA: 0.2 (ref 0.0–1.0)

## 2011-08-21 LAB — CBC WITH DIFFERENTIAL/PLATELET
Eosinophils Relative: 17 % — ABNORMAL HIGH (ref 0.0–5.0)
MCV: 103.5 fl — ABNORMAL HIGH (ref 78.0–100.0)
Monocytes Absolute: 0.6 10*3/uL (ref 0.1–1.0)
Neutrophils Relative %: 48 % (ref 43.0–77.0)
Platelets: 223 10*3/uL (ref 150.0–400.0)
WBC: 6.3 10*3/uL (ref 4.5–10.5)

## 2011-08-21 LAB — HEPATIC FUNCTION PANEL
Alkaline Phosphatase: 43 U/L (ref 39–117)
Bilirubin, Direct: 0.1 mg/dL (ref 0.0–0.3)
Total Bilirubin: 0.6 mg/dL (ref 0.3–1.2)
Total Protein: 7.2 g/dL (ref 6.0–8.3)

## 2011-08-21 LAB — BASIC METABOLIC PANEL
CO2: 27 mEq/L (ref 19–32)
Calcium: 9.4 mg/dL (ref 8.4–10.5)
GFR: 83.94 mL/min (ref >60.00)
Sodium: 141 mEq/L (ref 135–145)

## 2011-08-21 LAB — LDL CHOLESTEROL, DIRECT: Direct LDL: 100.7 mg/dL

## 2011-08-21 LAB — LIPID PANEL: VLDL: 28.4 mg/dL (ref 0.0–40.0)

## 2011-08-24 ENCOUNTER — Ambulatory Visit: Payer: 59

## 2011-08-24 DIAGNOSIS — Z0000 Encounter for general adult medical examination without abnormal findings: Secondary | ICD-10-CM

## 2011-08-24 LAB — FOLATE: Folate: 9.6 ng/mL

## 2011-08-25 DIAGNOSIS — M720 Palmar fascial fibromatosis [Dupuytren]: Secondary | ICD-10-CM

## 2011-08-25 HISTORY — DX: Palmar fascial fibromatosis (dupuytren): M72.0

## 2011-08-28 ENCOUNTER — Ambulatory Visit (INDEPENDENT_AMBULATORY_CARE_PROVIDER_SITE_OTHER): Payer: 59 | Admitting: Internal Medicine

## 2011-08-28 ENCOUNTER — Encounter: Payer: Self-pay | Admitting: Internal Medicine

## 2011-08-28 ENCOUNTER — Telehealth: Payer: Self-pay | Admitting: Internal Medicine

## 2011-08-28 VITALS — BP 104/68 | HR 88 | Temp 98.4°F | Ht 74.25 in | Wt 256.0 lb

## 2011-08-28 DIAGNOSIS — Z0000 Encounter for general adult medical examination without abnormal findings: Secondary | ICD-10-CM | POA: Insufficient documentation

## 2011-08-28 DIAGNOSIS — E538 Deficiency of other specified B group vitamins: Secondary | ICD-10-CM

## 2011-08-28 DIAGNOSIS — Z2911 Encounter for prophylactic immunotherapy for respiratory syncytial virus (RSV): Secondary | ICD-10-CM

## 2011-08-28 MED ORDER — SIMVASTATIN 20 MG PO TABS: 20.0000 mg | ORAL_TABLET | Freq: Every day | ORAL | Status: DC

## 2011-08-28 MED ORDER — AMLODIPINE BESYLATE 5 MG PO TABS: 5.0000 mg | ORAL_TABLET | Freq: Every day | ORAL | Status: DC

## 2011-08-28 MED ORDER — OLMESARTAN MEDOXOMIL 20 MG PO TABS: 20.0000 mg | ORAL_TABLET | Freq: Every day | ORAL | Status: DC

## 2011-08-28 NOTE — Patient Instructions (Addendum)
Please complete the following lab tests within 1 month: CBCD, B12 level, anti parietal cell antibody, anti intrinsic factor antibody - 266.2

## 2011-08-28 NOTE — Assessment & Plan Note (Signed)
Reviewed adult health maintenance protocols. Pt updated with shingles vaccine. I encouraged weight loss and regular exercise.

## 2011-08-28 NOTE — Progress Notes (Signed)
Subjective:    Patient ID: Austin Keller, male    DOB: [birth year], 66 y.o.   MRN: [id number]  HPI  66 year old white male with history of hypertension for routine physical. He denies significant interval medical history. He is scheduled for left hand surgery to correct his dupuytren's contracture.  He is considering retirement. He stays fairly active.  He still likes to golf.  We reviewed his recent blood test results. Patient has worsening macrocytosis. B12 was decreased at 152. He denies paresthesias or unusual memory loss.  Review of Systems     Constitutional: Negative for activity change, appetite change and unexpected weight change.  Eyes: Negative for visual disturbance.  Respiratory: Negative for cough, chest tightness and shortness of breath.   Cardiovascular: Negative for chest pain.  Genitourinary: Negative for difficulty urinating.  Neurological: Negative for headaches.  Gastrointestinal: Negative for abdominal pain, heartburn melena or hematochezia Psych: Negative for depression or anxiety     Past Medical History  Diagnosis Date  . Polyp of colon   . Hypertension   . Hyperlipidemia   . Allergic rhinitis   . Dupuytren's contracture of hand     left  . Speech disorder     History   Social History  . Marital Status: Married    Spouse Name: N/A    Number of Children: 3  . 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 Smoking status: Former Smoker    Quit date: 08/24/2000  . Smokeless tobacco: Not on file   Comment: 25 pack year history  . Alcohol Use: 0.0 oz/week    14-21 drink(s) per week     2-3 drinks per day  . Drug Use:   . Sexually Active:    Other Topics Concern  . Not on file   Social History Narrative   Wife is 7 yrs younger- works in [employer]    Past Surgical History  Procedure Date  . Tonsillectomy   . Left hand surgery     for Dupuytrens contracture     Family History  Problem Relation Age of Onset  . Hip fracture Mother   . Ulcers Father     gastric    Allergies  Allergen Reactions  . Ace Inhibitors     REACTION: cough    Current Outpatient Prescriptions on File Prior to Visit  Medication Sig Dispense Refill  . amLODipine (NORVASC) 5 MG tablet Take 5 mg by mouth daily.        Marland Kitchen olmesartan (BENICAR) 20 MG tablet Take 20 mg by mouth daily.        . simvastatin (ZOCOR) 20 MG tablet Take 20 mg by mouth at bedtime.          BP 104/68  Pulse 88  Temp(Src) 98.4 F (36.9 C) (Oral)  Ht 6' 2.25" (1.886 m)  Wt 256 lb (116.121 kg)  BMI 32.65 kg/m2    Objective:   Physical Exam  Constitutional: He is oriented to person, place, and time. He appears well-developed and well-nourished.  HENT:  Head: Normocephalic and atraumatic.  Right Ear: External ear normal.  Left Ear: External ear normal.  Eyes: Conjunctivae are normal. Pupils are equal, round, and reactive to light.  Neck: Normal range of motion. Neck supple.  Cardiovascular: Normal rate, regular rhythm, normal heart sounds and intact distal pulses.   Pulmonary/Chest: Effort normal and breath sounds  normal. No respiratory distress. He has no wheezes.  Abdominal: Bowel sounds are normal. He exhibits no distension and no mass. There is no tenderness.  Musculoskeletal: He exhibits no edema.  Lymphadenopathy:    He has no cervical adenopathy.  Neurological: He is alert and oriented to person, place, and time. No cranial nerve deficit.  Skin: Skin is warm and dry.  Psychiatric: He has a normal mood and affect. His behavior is normal.          Assessment & Plan:

## 2011-08-28 NOTE — Telephone Encounter (Signed)
Future orders placed 

## 2011-08-28 NOTE — Assessment & Plan Note (Signed)
Check antiparietal cell and body an anti-intrinsic factor antibody. Start B12 supplementation IM.

## 2011-08-28 NOTE — Telephone Encounter (Signed)
Patient is going to [facility] for labs...plese put orders in the computer.  Thanks

## 2011-08-30 ENCOUNTER — Other Ambulatory Visit: Payer: Self-pay | Admitting: Internal Medicine

## 2011-09-10 ENCOUNTER — Other Ambulatory Visit: Payer: Self-pay | Admitting: Orthopedic Surgery

## 2011-09-14 ENCOUNTER — Encounter (HOSPITAL_BASED_OUTPATIENT_CLINIC_OR_DEPARTMENT_OTHER)
Admission: RE | Admit: 2011-09-14 | Discharge: 2011-09-14 | Disposition: A | Discharge status: Home / Self Care | Payer: 59 | Source: Ambulatory Visit | Attending: Orthopedic Surgery | Admitting: Orthopedic Surgery

## 2011-09-14 ENCOUNTER — Encounter (HOSPITAL_BASED_OUTPATIENT_CLINIC_OR_DEPARTMENT_OTHER): Payer: Self-pay | Admitting: *Deleted

## 2011-09-14 ENCOUNTER — Other Ambulatory Visit: Payer: Self-pay

## 2011-09-14 ENCOUNTER — Encounter: Payer: Self-pay | Admitting: Cardiology

## 2011-09-14 DIAGNOSIS — Z01818 Encounter for other preprocedural examination: Secondary | ICD-10-CM | POA: Insufficient documentation

## 2011-09-14 DIAGNOSIS — Z01810 Encounter for preprocedural cardiovascular examination: Secondary | ICD-10-CM | POA: Insufficient documentation

## 2011-09-14 NOTE — Pre-Procedure Instructions (Signed)
To come for BMET and EKG 

## 2011-09-14 NOTE — Progress Notes (Signed)
Pre op ekg performed shown to dr kasik ,  Dr kasik into speak with pt.  Pt will need cardiac clear before surg.  I spoke with Trish of [facility] /  She will have office called pt in am with appointment.  I then spoke with robert pa for dr sypher.  Instructed to cancel surg for the 24 th.  All areas notifed . 

## 2011-09-15 ENCOUNTER — Telehealth: Payer: Self-pay | Admitting: Internal Medicine

## 2011-09-15 ENCOUNTER — Other Ambulatory Visit: Payer: Self-pay | Admitting: Orthopedic Surgery

## 2011-09-15 NOTE — Telephone Encounter (Signed)
Wants to speak with Dr Artist Pais. Pt is going to have surgery on his hand this Thursday. Dr Artist Pais recommended not to have the nerve block due side effects. But the surgeon wants to do anethesia  and the nerve block. Please advise. Thanks.

## 2011-09-16 ENCOUNTER — Encounter: Payer: Self-pay | Admitting: Cardiology

## 2011-09-16 ENCOUNTER — Ambulatory Visit (INDEPENDENT_AMBULATORY_CARE_PROVIDER_SITE_OTHER): Payer: 59 | Admitting: Cardiology

## 2011-09-16 VITALS — BP 126/68 | HR 69 | Ht 75.0 in | Wt 258.0 lb

## 2011-09-16 DIAGNOSIS — I4949 Other premature depolarization: Secondary | ICD-10-CM

## 2011-09-16 DIAGNOSIS — Z01810 Encounter for preprocedural cardiovascular examination: Secondary | ICD-10-CM

## 2011-09-16 DIAGNOSIS — E7800 Pure hypercholesterolemia, unspecified: Secondary | ICD-10-CM

## 2011-09-16 DIAGNOSIS — I493 Ventricular premature depolarization: Secondary | ICD-10-CM

## 2011-09-16 NOTE — Patient Instructions (Addendum)
Your physician has requested that you have an echocardiogram. Echocardiography is a painless test that uses sound waves to create images of your heart. It provides your doctor with information about the size and shape of your heart and how well your heart's chambers and valves are working. This procedure takes approximately one hour. There are no restrictions for this procedure.  Your physician has requested that you have en exercise stress myoview. For further information please visit [email or web address] Please follow instruction sheet, as given.  Will have you go to the [facility] building on [address] (across from [facility])  Your physician recommends that you continue on your current medications as directed. Please refer to the Current Medication list given to you today.

## 2011-09-16 NOTE — Progress Notes (Signed)
Reason for Consult: Abnormal EKG, consecutive PVCs Referring Physician: Dr. Teressa Senter; Austin Keller is an 66 y.o. male.  HPI: This 66 year old gentleman is being evaluated for preoperative cardiac clearance in regard to upcoming hand surgery.  His preoperative electrocardiogram done on 09/14/11 at [facility] showed frequent and consecutive premature ventricular complexes.  The patient does not have any history of known heart problems.  He states that he had an electrocardiogram a year ago which was "okay".  He does not give any history of chest pain or shortness of breath.  He does have a remote history of asthma.  He does not give any intentional aerobic exercise other than for playing golf and he rides a cart for golf.  He works in [employer] in a sedentary job.  In regard to his PVCs, he is vaguely aware that his heart is skipping.  It is never really bothered him.  He denies any history of dizziness or syncope.  He has multiple risk factors for premature coronary disease including being a former smoker, and having a history of high blood pressure and elevated cholesterol.  Of note is the fact that the patient also consumes moderate alcohol drinking 2 drinks of bourbon each night.  Past Medical History  Diagnosis Date  . Hyperlipidemia   . Dupuytren's contracture of hand 08/2011    left  . Speech disorder   . Hypertension     under control; has been on med. x 2-3 yrs.  . Asthma     as a child    Past Surgical History  Procedure Date  . Tonsillectomy   . Hand surgery     left    Family History  Problem Relation Age of Onset  . Hip fracture Mother   . Ulcers Father     gastric    Social History:  reports that he has quit smoking. He has never used smokeless tobacco. He reports that he drinks alcohol. He reports that he does not use illicit drugs.  Allergies: No Known Allergies  Medications: I have reviewed the patient's current medications.  No results found  for this or any previous visit (from the past 48 hour(s)).  No results found.  Review of systems is negative except as noted above.  The patient denies any gastrointestinal or genitourinary problems.  He has significant Dupuytren contracture of the left hand.  His right hand is fine Blood pressure 126/68, pulse 69, height 6\' 3"  (1.905 m), weight 258 lb (117.028 kg). The general appearance reveals a well-developed well-nourished gentleman in no distress.Pupils equal and reactive.   Extraocular Movements are full.  There is no scleral icterus.  The mouth and pharynx are normal.  The neck is supple.  The carotids reveal no bruits.  The jugular venous pressure is normal.  The thyroid is not enlarged.  There is no lymphadenopathy.  The chest is clear to percussion and auscultation. There are no rales or rhonchi. Expansion of the chest is symmetrical.  The precordium is quiet.  The first heart sound is normal.  The second heart sound is physiologically split.  There is no murmur gallop rub or click.  There is no abnormal lift or heave.  Frequent premature beats are noted.The abdomen is soft and nontender. Bowel sounds are normal. The liver and spleen are not enlarged. There Are no abdominal masses. There are no bruits.  Extremities reveal no phlebitis or edema.  Pedal pulses are good.  Extremities reveal Dupuytren  contracture of the left hand.Strength is normal and symmetrical in all extremities.  There is no lateralizing weakness.  There are no sensory deficits.  The skin is warm and dry.  There is no rash.  EKG done/21/13 was reviewed and shows no ischemic changes at rest and does show frequent and consecutive PVCs including one run of 3 consecutive PVCs.   Assessment/Plan: The etiology of his ventricular arrhythmia is not clear at the present time.  We are going to check a chest x-ray and also obtain a two-dimensional echocardiogram and have him also return for a treadmill Myoview stress test.  Of  note is the fact that the patient had normal electrolytes and thyroid function studies several weeks ago in preparation for his annual physical with his primary care provider.o discussed the fact that alcohol itself may cause arrhythmias in sensitive individuals and it would be prudent to cut back on his alcohol intake.  Many thanks for the opportunity to see this pleasant gentleman and I will be in touch with you in regard to the results of his workup.  Cassell Clement 09/16/2011, 4:52 PM

## 2011-09-16 NOTE — Telephone Encounter (Signed)
Spoke with pt.   He had pre op EKG which showed PVCs.  Pt referred to Dr. Patty Sermons for further eval.  I agree.

## 2011-09-17 ENCOUNTER — Ambulatory Visit (HOSPITAL_BASED_OUTPATIENT_CLINIC_OR_DEPARTMENT_OTHER): Admission: RE | Admit: 2011-09-17 | Payer: 59 | Source: Ambulatory Visit | Admitting: Orthopedic Surgery

## 2011-09-17 ENCOUNTER — Encounter (HOSPITAL_BASED_OUTPATIENT_CLINIC_OR_DEPARTMENT_OTHER): Admission: RE | Payer: Self-pay | Source: Ambulatory Visit

## 2011-09-17 SURGERY — RELEASE, DUPUYTREN CONTRACTURE
Anesthesia: General | Laterality: Left

## 2011-09-21 ENCOUNTER — Ambulatory Visit (INDEPENDENT_AMBULATORY_CARE_PROVIDER_SITE_OTHER)
Admission: RE | Admit: 2011-09-21 | Discharge: 2011-09-21 | Disposition: A | Discharge status: Home / Self Care | Payer: 59 | Source: Ambulatory Visit | Attending: Cardiology | Admitting: Cardiology

## 2011-09-21 DIAGNOSIS — I493 Ventricular premature depolarization: Secondary | ICD-10-CM

## 2011-09-21 DIAGNOSIS — I4949 Other premature depolarization: Secondary | ICD-10-CM

## 2011-09-21 DIAGNOSIS — Z01810 Encounter for preprocedural cardiovascular examination: Secondary | ICD-10-CM

## 2011-09-21 DIAGNOSIS — E7800 Pure hypercholesterolemia, unspecified: Secondary | ICD-10-CM

## 2011-09-21 NOTE — Progress Notes (Signed)
Addended by: Alfred Levins D on: 09/21/2011 01:28 PM   Modules accepted: Orders

## 2011-09-22 ENCOUNTER — Ambulatory Visit (HOSPITAL_COMMUNITY): Payer: 59 | Attending: Cardiology

## 2011-09-22 ENCOUNTER — Telehealth: Payer: Self-pay | Admitting: *Deleted

## 2011-09-22 DIAGNOSIS — I10 Essential (primary) hypertension: Secondary | ICD-10-CM | POA: Insufficient documentation

## 2011-09-22 DIAGNOSIS — Z01810 Encounter for preprocedural cardiovascular examination: Secondary | ICD-10-CM

## 2011-09-22 DIAGNOSIS — E7800 Pure hypercholesterolemia, unspecified: Secondary | ICD-10-CM

## 2011-09-22 DIAGNOSIS — I493 Ventricular premature depolarization: Secondary | ICD-10-CM

## 2011-09-22 DIAGNOSIS — E785 Hyperlipidemia, unspecified: Secondary | ICD-10-CM | POA: Insufficient documentation

## 2011-09-22 DIAGNOSIS — I4949 Other premature depolarization: Secondary | ICD-10-CM | POA: Insufficient documentation

## 2011-09-22 DIAGNOSIS — Z87891 Personal history of nicotine dependence: Secondary | ICD-10-CM | POA: Insufficient documentation

## 2011-09-22 NOTE — Telephone Encounter (Signed)
Message copied by Burnell Blanks on Tue Sep 22, 2011  9:04 AM ------      Message from: Cassell Clement      Created: Mon Sep 21, 2011  5:21 PM       Please report.  The chest x-ray shows normal heart size and no evidence of congestive heart failure.

## 2011-09-22 NOTE — Telephone Encounter (Signed)
Advised of xray results 

## 2011-09-25 ENCOUNTER — Telehealth: Payer: Self-pay | Admitting: Cardiology

## 2011-09-25 NOTE — Telephone Encounter (Signed)
Pt rtn call to [facility] from yesterday, re test results

## 2011-09-25 NOTE — Telephone Encounter (Signed)
Reviewed results of echo with pt who states understanding.  He will have stress testing as scheduled on Monday.

## 2011-09-25 NOTE — Telephone Encounter (Signed)
Left message for pt to call back  °

## 2011-09-28 ENCOUNTER — Ambulatory Visit (HOSPITAL_COMMUNITY): Payer: 59 | Attending: Cardiology | Admitting: Radiology

## 2011-09-28 DIAGNOSIS — E669 Obesity, unspecified: Secondary | ICD-10-CM | POA: Insufficient documentation

## 2011-09-28 DIAGNOSIS — I10 Essential (primary) hypertension: Secondary | ICD-10-CM | POA: Insufficient documentation

## 2011-09-28 DIAGNOSIS — I493 Ventricular premature depolarization: Secondary | ICD-10-CM

## 2011-09-28 DIAGNOSIS — Z87891 Personal history of nicotine dependence: Secondary | ICD-10-CM | POA: Insufficient documentation

## 2011-09-28 DIAGNOSIS — R9431 Abnormal electrocardiogram [ECG] [EKG]: Secondary | ICD-10-CM | POA: Insufficient documentation

## 2011-09-28 DIAGNOSIS — R0989 Other specified symptoms and signs involving the circulatory and respiratory systems: Secondary | ICD-10-CM | POA: Insufficient documentation

## 2011-09-28 DIAGNOSIS — R0602 Shortness of breath: Secondary | ICD-10-CM

## 2011-09-28 DIAGNOSIS — E785 Hyperlipidemia, unspecified: Secondary | ICD-10-CM

## 2011-09-28 DIAGNOSIS — J45909 Unspecified asthma, uncomplicated: Secondary | ICD-10-CM | POA: Insufficient documentation

## 2011-09-28 DIAGNOSIS — E7800 Pure hypercholesterolemia, unspecified: Secondary | ICD-10-CM

## 2011-09-28 DIAGNOSIS — Z01810 Encounter for preprocedural cardiovascular examination: Secondary | ICD-10-CM | POA: Insufficient documentation

## 2011-09-28 DIAGNOSIS — R0609 Other forms of dyspnea: Secondary | ICD-10-CM | POA: Insufficient documentation

## 2011-09-28 MED ORDER — TECHNETIUM TC 99M TETROFOSMIN IV KIT
11.0000 | PACK | Freq: Once | INTRAVENOUS | Status: AC | PRN
  Administered 2011-09-28: 11 via INTRAVENOUS

## 2011-09-28 MED ORDER — TECHNETIUM TC 99M TETROFOSMIN IV KIT
33.0000 | PACK | Freq: Once | INTRAVENOUS | Status: AC | PRN
  Administered 2011-09-28: 33 via INTRAVENOUS

## 2011-09-28 NOTE — Progress Notes (Signed)
[facility] SITE 3 NUCLEAR MED [address] [place] [address] [phone]  Cardiology Nuclear Med Study  Austin Keller is a 66 y.o. male [id number] 12-14-1945   Nuclear Med Background Indication for Stress Test:  Evaluation for Ischemia; Abnormal EKG and Pending Hand Surgery by Dr. Teressa Senter History:  Asthma and 09/22/11 Echo:EF=55-65%, mild LVH Cardiac Risk Factors: History of Smoking, Hypertension, Lipids and Obesity  Symptoms:  DOE   Nuclear Pre-Procedure Caffeine/Decaff Intake:  None NPO After: 8:00pm   Lungs:  Clear. IV 0.9% NS with Angio Cath:  20g  IV Site: R Hand  IV Started by:  Cathlyn Parsons, RN  Chest Size (in):  46 Cup Size: n/a  Height: 6\' 3"  (1.905 m)  Weight:  257 lb (116.574 kg)  BMI:  Body mass index is 32.12 kg/(m^2). Tech Comments:  n/a    Nuclear Med Study 1 or 2 day study: 1 day  Stress Test Type:  Stress  Reading MD: Olga Millers, MD  Order Authorizing Provider:  Cassell Clement, MD  Resting Radionuclide: Technetium 46m Tetrofosmin  Resting Radionuclide Dose: 11.0 mCi   Stress Radionuclide:  Technetium 6m Tetrofosmin  Stress Radionuclide Dose: 33.0 mCi           Stress Protocol Rest HR: 69 Stress HR: 148  Rest BP: Sitting 124/75  Standing 138/74 Stress BP: 182/83  Exercise Time (min): 5:02 METS: 7.0   Predicted Max HR: 153 bpm % Max HR: 96.73 bpm Rate Pressure Product: 21308   Dose of Adenosine (mg):  n/a Dose of Lexiscan: n/a mg  Dose of Atropine (mg): n/a Dose of Dobutamine: n/a mcg/kg/min (at max HR)  Stress Test Technologist: Smiley Houseman, CMA-N  Nuclear Technologist:  Domenic Polite, CNMT     Rest Procedure:  Myocardial perfusion imaging was performed at rest 45 minutes following the intravenous administration of Technetium 78m Tetrofosmin.  Rest ECG: PVC's with couplets and one 3-beat run of v-tach, otherwise within normal limits.  Stress Procedure:  The patient exercised for 5:02 on the treadmill  utilizing the Bruce protocol.  The patient stopped due to fatigue and denied any chest pain.  There were ST-T wave changes, occasional PVC's and a short burst of SVT.  Technetium 52m Tetrofosmin was injected at peak exercise and myocardial perfusion imaging was performed after a brief delay.  EKG and images were discussed with Dr. Patty Sermons and it was OK for patient to be discharged and to follow up with Dr. Patty Sermons for further "studies".  Stress ECG: Insignificant upsloping ST segment depression.  QPS Raw Data Images:  Acquisition technically good; normal left ventricular size. Stress Images:  There is decreased uptake in the inferior wall. Rest Images:  There is decreased uptake in the inferior wall, less prominent compared to the stress images. Subtraction (SDS):  These findings are consistent with prior inferior infarct and mild peri-infarct ischemia. Transient Ischemic Dilatation (Normal <1.22):  0.90 Lung/Heart Ratio (Normal <0.45):  0.31  Quantitative Gated Spect Images QGS EDV:  104 ml QGS ESV:  33 ml QGS cine images:  NL LV Function; NL Wall Motion QGS EF: 69%  Impression Exercise Capacity:  Fair exercise capacity. BP Response:  Normal blood pressure response. Clinical Symptoms:  No chest pain. ECG Impression:  Insignificant upsloping ST segment depression; occasional PVCs and 3 beats NSVT. Comparison with Prior Nuclear Study: No previous nuclear study performed  Overall Impression:  Abnormal stress nuclear study with a small to moderate size, partially reversible inferior defect consistent  with prior inferior infarct and mild peri-infarct ischemia.    Olga Millers

## 2011-09-29 ENCOUNTER — Encounter: Payer: Self-pay | Admitting: *Deleted

## 2011-09-29 ENCOUNTER — Other Ambulatory Visit: Payer: Self-pay | Admitting: Cardiology

## 2011-09-29 ENCOUNTER — Encounter: Payer: Self-pay | Admitting: Cardiology

## 2011-09-29 ENCOUNTER — Telehealth: Payer: Self-pay | Admitting: Cardiology

## 2011-09-29 DIAGNOSIS — R9439 Abnormal result of other cardiovascular function study: Secondary | ICD-10-CM

## 2011-09-29 DIAGNOSIS — Z01818 Encounter for other preprocedural examination: Secondary | ICD-10-CM

## 2011-09-29 NOTE — Telephone Encounter (Signed)
Scheduled for cath on 2/8 and labs tomorrow.  Advised patient

## 2011-09-29 NOTE — Telephone Encounter (Signed)
New Problem   Patient is returning call to nurse regarding his upcoming procedure, he can be reached at Cell or Wrk#

## 2011-09-30 ENCOUNTER — Telehealth: Payer: Self-pay | Admitting: *Deleted

## 2011-09-30 ENCOUNTER — Other Ambulatory Visit (INDEPENDENT_AMBULATORY_CARE_PROVIDER_SITE_OTHER): Payer: 59 | Admitting: *Deleted

## 2011-09-30 ENCOUNTER — Other Ambulatory Visit: Payer: Self-pay | Admitting: *Deleted

## 2011-09-30 DIAGNOSIS — Z01818 Encounter for other preprocedural examination: Secondary | ICD-10-CM

## 2011-09-30 LAB — APTT: aPTT: 27.4 s (ref 21.7–28.8)

## 2011-09-30 LAB — CBC WITH DIFFERENTIAL/PLATELET
Basophils Absolute: 0.1 10*3/uL (ref 0.0–0.1)
Basophils Relative: 1.7 % (ref 0.0–3.0)
Eosinophils Absolute: 0.9 10*3/uL — ABNORMAL HIGH (ref 0.0–0.7)
Lymphocytes Relative: 20.4 % (ref 12.0–46.0)
MCHC: 34.4 g/dL (ref 30.0–36.0)
Neutrophils Relative %: 53.3 % (ref 43.0–77.0)
Platelets: 244 10*3/uL (ref 150.0–400.0)
RBC: 3.91 Mil/uL — ABNORMAL LOW (ref 4.22–5.81)
WBC: 7 10*3/uL (ref 4.5–10.5)

## 2011-09-30 LAB — BASIC METABOLIC PANEL
BUN: 15 mg/dL (ref 6–23)
Chloride: 104 mEq/L (ref 96–112)
Potassium: 3.9 mEq/L (ref 3.5–5.1)
Sodium: 139 mEq/L (ref 135–145)

## 2011-09-30 LAB — PROTIME-INR: Prothrombin Time: 11.6 s (ref 10.2–12.4)

## 2011-09-30 NOTE — Telephone Encounter (Signed)
Message copied by Burnell Blanks on Wed Sep 30, 2011  5:38 PM ------      Message from: Cassell Clement      Created: Wed Sep 30, 2011  5:17 PM       Labs are fine for cath. Please report.

## 2011-09-30 NOTE — Telephone Encounter (Signed)
Advised patient

## 2011-09-30 NOTE — Telephone Encounter (Signed)
Message copied by Burnell Blanks on Wed Sep 30, 2011  9:58 AM ------      Message from: Cassell Clement      Created: Tue Sep 29, 2011 10:38 AM       Please report.  The nuclear stress test was abnormal.  There is an area of inferior scar with peri-infarct ischemia.  I called the patient and recommended cardiac catheterization.  We will plan for the JV lab.  The patient is agreeable to proceeding.

## 2011-09-30 NOTE — Telephone Encounter (Signed)
Cath scheduled 2/8, advised patient

## 2011-10-02 ENCOUNTER — Inpatient Hospital Stay (HOSPITAL_BASED_OUTPATIENT_CLINIC_OR_DEPARTMENT_OTHER)
Admission: RE | Admit: 2011-10-02 | Discharge: 2011-10-02 | Disposition: A | Discharge status: Home / Self Care | Payer: 59 | Source: Ambulatory Visit | Attending: Cardiology | Admitting: Cardiology

## 2011-10-02 ENCOUNTER — Encounter (HOSPITAL_BASED_OUTPATIENT_CLINIC_OR_DEPARTMENT_OTHER)
Admission: RE | Disposition: A | Discharge status: Home / Self Care | Payer: Self-pay | Source: Ambulatory Visit | Attending: Cardiology

## 2011-10-02 DIAGNOSIS — R9439 Abnormal result of other cardiovascular function study: Secondary | ICD-10-CM

## 2011-10-02 DIAGNOSIS — I2510 Atherosclerotic heart disease of native coronary artery without angina pectoris: Secondary | ICD-10-CM | POA: Insufficient documentation

## 2011-10-02 HISTORY — DX: Atherosclerotic heart disease of native coronary artery without angina pectoris: I25.10

## 2011-10-02 HISTORY — PX: CARDIAC CATHETERIZATION: SHX172

## 2011-10-02 SURGERY — JV LEFT HEART CATHETERIZATION WITH CORONARY ANGIOGRAM
Anesthesia: Moderate Sedation

## 2011-10-02 MED ORDER — SODIUM CHLORIDE 0.9 % IJ SOLN: 3.0000 mL | INTRAMUSCULAR | Status: DC | PRN

## 2011-10-02 MED ORDER — SODIUM CHLORIDE 0.9 % IV SOLN: 250.0000 mL | INTRAVENOUS | Status: DC | PRN

## 2011-10-02 MED ORDER — ACETAMINOPHEN 325 MG PO TABS: 650.0000 mg | ORAL_TABLET | ORAL | Status: DC | PRN

## 2011-10-02 MED ORDER — ONDANSETRON HCL 4 MG/2ML IJ SOLN: 4.0000 mg | Freq: Four times a day (QID) | INTRAMUSCULAR | Status: DC | PRN

## 2011-10-02 MED ORDER — SODIUM CHLORIDE 0.9 % IJ SOLN
3.0000 mL | Freq: Two times a day (BID) | INTRAMUSCULAR | Status: DC
  Administered 2011-10-02: 3 mL via INTRAVENOUS

## 2011-10-02 MED ORDER — DIAZEPAM 5 MG PO TABS
5.0000 mg | ORAL_TABLET | ORAL | Status: AC
  Administered 2011-10-02: 5 mg via ORAL

## 2011-10-02 MED ORDER — ASPIRIN 81 MG PO CHEW: 324.0000 mg | CHEWABLE_TABLET | ORAL | Status: DC

## 2011-10-02 MED ORDER — SODIUM CHLORIDE 0.9 % IV SOLN: INTRAVENOUS | Status: DC

## 2011-10-02 NOTE — Procedures (Signed)
  Cardiac Catheterization Procedure Note  Name: Austin Keller MRN: [id number] DOB: Mar 02, 1946  Procedure: Left Heart Cath, Selective Coronary Angiography, LV angiography  Indication:   Abnormal stress test suggesting inferior ischemia.  Procedural details: The right groin was prepped, draped, and anesthetized with 1% lidocaine. Using modified Seldinger technique, a 4 French sheath was introduced into the right femoral artery. Standard Judkins catheters were used for coronary angiography and left ventriculography. Catheter exchanges were performed over a guidewire. There were no immediate procedural complications. The patient was transferred to the post catheterization recovery area for further monitoring.  Procedural Findings:  Hemodynamics:     AO 126/89     LV  125/16   Coronary angiography:  Coronary dominance: Co dominant  Left mainstem:   Normal  Left anterior descending (LAD):   The LAD was large wrapping the apex. There was moderate proximal and mid calcification. There was a proximal 30-40% stenosis. This was followed by 25% stenosis. In the mid vessel there was a long 40-50% stenosis. Beyond this there was no disease. First diagonal was small and normal. Second diagonal is moderate sized and normal.  Left circumflex (LCx):  Co dominant. This is a large vessel. The AV groove was normal. It was a large moderate-sized mid obtuse marginal with on proximal 25% stenosis. The PDA off the circumflex had mild luminal irregularities.  Right coronary artery (RCA):  Co dominant. The moderate size vessel. There is proximal 25% stenosis. The PDA is moderate size and normal.  Left ventriculography: Left ventricular systolic function is normal, LVEF is estimated at 55-65%, there is no significant mitral regurgitation   Final Conclusions:  Moderate LAD disease with calcification.  No obstructive CAD.  NL LV function.  Recommendations:  Primary risk reduction.    Rollene Rotunda 10/02/2011,  1:09 PM

## 2011-10-02 NOTE — Interval H&P Note (Signed)
History and Physical Interval Note:  10/02/2011 12:39 PM  Austin Keller  has presented today for surgery, with the diagnosis of + stress test  The various methods of treatment have been discussed with the patient and family. After consideration of risks, benefits and other options for treatment, the patient has consented to  Procedure(s): JV LEFT HEART CATHETERIZATION WITH CORONARY ANGIOGRAM as a surgical intervention .  The patients' history has been reviewed, patient examined, no change in status, stable for surgery.  I have reviewed the patients' chart and labs.  Questions were answered to the patient's satisfaction.     Rollene Rotunda

## 2011-10-02 NOTE — Progress Notes (Signed)
Discharge instructions completed, ambulated to bathroom without bleeding from right groin site, discharged to home via wheelchair with wife.

## 2011-10-02 NOTE — H&P (View-Only) (Signed)
Pre op ekg performed shown to dr Gypsy Balsam ,  Dr Gypsy Balsam into speak with pt.  Pt will need cardiac clear before surg.  I spoke with Trish of [facility] /  She will have office called pt in am with appointment.  I then spoke with Molly Maduro pa for dr sypher.  Instructed to cancel surg for the 24 th.  All areas notifed .

## 2011-10-16 ENCOUNTER — Ambulatory Visit (INDEPENDENT_AMBULATORY_CARE_PROVIDER_SITE_OTHER): Payer: 59 | Admitting: Nurse Practitioner

## 2011-10-16 ENCOUNTER — Encounter: Payer: Self-pay | Admitting: Nurse Practitioner

## 2011-10-16 VITALS — BP 120/56 | HR 82 | Ht 75.0 in | Wt 263.0 lb

## 2011-10-16 DIAGNOSIS — I2510 Atherosclerotic heart disease of native coronary artery without angina pectoris: Secondary | ICD-10-CM | POA: Insufficient documentation

## 2011-10-16 NOTE — Progress Notes (Signed)
   Pauletta Browns Date of Birth: [birth year] Medical Record #[id number]  History of Present Illness: Mr. Lemmerman is seen back today for a post cath visit. He is seen for Dr. Patty Sermons. He has had recent cath following an abnormal stress test. His cath shows moderate LAD disease with calcification. No obstructive disease. Normal LV function. LDL is 100.   He comes in today. He feels good. Has no complaints. Has had no issues with his groin. Tolerating his medicines. Is trying to get his hand surgery rescheduled. This was what originally led to his stress test and subsequent cath.   Current Outpatient Prescriptions on File Prior to Visit  Medication Sig Dispense Refill  . amLODipine (NORVASC) 5 MG tablet Take 1 tablet (5 mg total) by mouth daily.  90 tablet  3  . BENICAR 20 MG tablet TAKE 1 TABLET EVERY DAY  90 tablet  3  . cholecalciferol (VITAMIN D) 400 UNITS TABS Take by mouth.      . fish oil-omega-3 fatty acids 1000 MG capsule Take 2 g by mouth daily.      . simvastatin (ZOCOR) 20 MG tablet Take 1 tablet (20 mg total) by mouth at bedtime.  90 tablet  3    No Known Allergies  Past Medical History  Diagnosis Date  . Hyperlipidemia   . Dupuytren's contracture of hand 08/2011    left  . Speech disorder   . Hypertension     under control; has been on med. x 2-3 yrs.  . Asthma     as a child  . CAD (coronary artery disease) 10/02/11    Moderate LAD disease with calcification. No obstructive CAD. LV function was normal.     Past Surgical History  Procedure Date  . Tonsillectomy   . Hand surgery     left  . Cardiac catheterization 10/02/2011    Moderate LAD disease with calcification. No obstructive CAD. Normal LV function.    History  Smoking status  . Former Smoker  Smokeless tobacco  . Never Used  Comment: quit smoking 12-15 yrs. ago    History  Alcohol Use  . 0.0 oz/week    2 drinks/day    Family History  Problem Relation Age of Onset  . Hip fracture Mother   .  Ulcers Father     gastric    Review of Systems: The review of systems is per the HPI. Admits that he needs to do better with diet and exercise. No cardiac complaints.  All other systems were reviewed and are negative.  Physical Exam: BP 120/56  Pulse 82  Ht 6\' 3"  (1.905 m)  Wt 263 lb (119.296 kg)  BMI 32.87 kg/m2 Patient is very pleasant and in no acute distress. Skin is warm and dry. Color is normal.  HEENT is unremarkable. Normocephalic/atraumatic. PERRL. Sclera are nonicteric. Neck is supple. No masses. No JVD. Lungs are clear. Cardiac exam shows a regular rate and rhythm. Abdomen is soft. Extremities are without edema. Gait and ROM are intact. No gross neurologic deficits noted.  LABORATORY DATA:   Assessment / Plan:

## 2011-10-16 NOTE — Assessment & Plan Note (Signed)
He is s/p cath from earlier this month. Will be managed with risk factor modification. Diet/exercise and weight loss are encouraged. No change in his current medicines. Will see him back in 4 months. He should be ok for his hand surgery. Patient is agreeable to this plan and will call if any problems develop in the interim.

## 2011-10-16 NOTE — Patient Instructions (Signed)
Stay on your current medicines.  Here are my tips to lose weight:  1. Drink only water. You do not need milk, juice, tea, soda or diet soda.  2. Do not eat anything "white". This includes white bread, potatoes, rice or mayo  3. Stay away from fried foods and sweets  4. Your portion should be the size of the palm of your hand.  5. Know what your weaknesses are and avoid.  6. Find an exercise you like and do it every day for 45 to 60 minutes.       We will see you back in 4 months.   Call the [facility] office at [phone] if you have any questions, problems or concerns.

## 2011-11-03 ENCOUNTER — Other Ambulatory Visit: Payer: Self-pay | Admitting: Orthopedic Surgery

## 2011-11-05 ENCOUNTER — Encounter (HOSPITAL_BASED_OUTPATIENT_CLINIC_OR_DEPARTMENT_OTHER): Payer: Self-pay | Admitting: *Deleted

## 2011-11-05 MED ORDER — DIAZEPAM 5 MG PO TABS
ORAL_TABLET | ORAL | Status: AC
  Filled 2011-11-05: qty 1

## 2011-11-05 MED ORDER — ASPIRIN 81 MG PO CHEW
CHEWABLE_TABLET | ORAL | Status: AC
  Filled 2011-11-05: qty 4

## 2011-11-05 NOTE — Progress Notes (Signed)
surg cancelled due to abn ekg-saw cardi9ology-had cath-cleared for surg

## 2011-11-10 ENCOUNTER — Ambulatory Visit (HOSPITAL_BASED_OUTPATIENT_CLINIC_OR_DEPARTMENT_OTHER)
Admission: RE | Admit: 2011-11-10 | Discharge: 2011-11-10 | Disposition: A | Discharge status: Home / Self Care | Payer: 59 | Source: Ambulatory Visit | Attending: Orthopedic Surgery | Admitting: Orthopedic Surgery

## 2011-11-10 ENCOUNTER — Encounter (HOSPITAL_BASED_OUTPATIENT_CLINIC_OR_DEPARTMENT_OTHER): Payer: Self-pay | Admitting: Certified Registered Nurse Anesthetist

## 2011-11-10 ENCOUNTER — Ambulatory Visit (HOSPITAL_BASED_OUTPATIENT_CLINIC_OR_DEPARTMENT_OTHER): Payer: 59 | Admitting: Anesthesiology

## 2011-11-10 ENCOUNTER — Encounter (HOSPITAL_BASED_OUTPATIENT_CLINIC_OR_DEPARTMENT_OTHER): Payer: Self-pay | Admitting: *Deleted

## 2011-11-10 ENCOUNTER — Encounter (HOSPITAL_BASED_OUTPATIENT_CLINIC_OR_DEPARTMENT_OTHER): Payer: Self-pay | Admitting: Anesthesiology

## 2011-11-10 ENCOUNTER — Encounter (HOSPITAL_BASED_OUTPATIENT_CLINIC_OR_DEPARTMENT_OTHER)
Admission: RE | Disposition: A | Discharge status: Home / Self Care | Payer: Self-pay | Source: Ambulatory Visit | Attending: Orthopedic Surgery

## 2011-11-10 DIAGNOSIS — I2510 Atherosclerotic heart disease of native coronary artery without angina pectoris: Secondary | ICD-10-CM | POA: Insufficient documentation

## 2011-11-10 DIAGNOSIS — M720 Palmar fascial fibromatosis [Dupuytren]: Secondary | ICD-10-CM | POA: Insufficient documentation

## 2011-11-10 DIAGNOSIS — J45909 Unspecified asthma, uncomplicated: Secondary | ICD-10-CM | POA: Insufficient documentation

## 2011-11-10 DIAGNOSIS — M629 Disorder of muscle, unspecified: Secondary | ICD-10-CM | POA: Insufficient documentation

## 2011-11-10 DIAGNOSIS — M2420 Disorder of ligament, unspecified site: Secondary | ICD-10-CM | POA: Insufficient documentation

## 2011-11-10 DIAGNOSIS — I10 Essential (primary) hypertension: Secondary | ICD-10-CM | POA: Insufficient documentation

## 2011-11-10 DIAGNOSIS — E785 Hyperlipidemia, unspecified: Secondary | ICD-10-CM | POA: Insufficient documentation

## 2011-11-10 HISTORY — PX: DUPUYTREN CONTRACTURE RELEASE: SHX1478

## 2011-11-10 LAB — POCT I-STAT, CHEM 8
Calcium, Ion: 1.17 mmol/L (ref 1.12–1.32)
Chloride: 108 mEq/L (ref 96–112)
Glucose, Bld: 91 mg/dL (ref 70–99)
HCT: 42 % (ref 39.0–52.0)

## 2011-11-10 SURGERY — RELEASE, DUPUYTREN CONTRACTURE
Anesthesia: General | Site: Hand | Laterality: Left | Wound class: Clean

## 2011-11-10 MED ORDER — LACTATED RINGERS IV SOLN
INTRAVENOUS | Status: DC
  Administered 2011-11-10: 11:00:00 via INTRAVENOUS

## 2011-11-10 MED ORDER — LIDOCAINE-EPINEPHRINE 1.5-1:200000 % IJ SOLN
INTRAMUSCULAR | Status: DC | PRN
  Administered 2011-11-10: 25 mL via INTRADERMAL

## 2011-11-10 MED ORDER — OXYCODONE-ACETAMINOPHEN 5-325 MG PO TABS: ORAL_TABLET | ORAL | Status: DC

## 2011-11-10 MED ORDER — FENTANYL CITRATE 0.05 MG/ML IJ SOLN: 25.0000 ug | INTRAMUSCULAR | Status: DC | PRN

## 2011-11-10 MED ORDER — MIDAZOLAM HCL 2 MG/2ML IJ SOLN
1.0000 mg | INTRAMUSCULAR | Status: DC | PRN
  Administered 2011-11-10 (×2): 1 mg via INTRAVENOUS

## 2011-11-10 MED ORDER — SILVER SULFADIAZINE 1 % EX CREA
TOPICAL_CREAM | CUTANEOUS | Status: DC | PRN
  Administered 2011-11-10: 1 via TOPICAL

## 2011-11-10 MED ORDER — PROPOFOL 10 MG/ML IV EMUL
INTRAVENOUS | Status: DC | PRN
  Administered 2011-11-10: 200 mg via INTRAVENOUS

## 2011-11-10 MED ORDER — FENTANYL CITRATE 0.05 MG/ML IJ SOLN
50.0000 ug | INTRAMUSCULAR | Status: DC | PRN
  Administered 2011-11-10 (×2): 50 ug via INTRAVENOUS

## 2011-11-10 MED ORDER — CHLORHEXIDINE GLUCONATE 4 % EX LIQD: 60.0000 mL | Freq: Once | CUTANEOUS | Status: DC

## 2011-11-10 MED ORDER — ONDANSETRON HCL 4 MG/2ML IJ SOLN
INTRAMUSCULAR | Status: DC | PRN
  Administered 2011-11-10: 4 mg via INTRAVENOUS

## 2011-11-10 MED ORDER — CEFAZOLIN SODIUM 1-5 GM-% IV SOLN
INTRAVENOUS | Status: DC | PRN
  Administered 2011-11-10: 2 g via INTRAVENOUS

## 2011-11-10 MED ORDER — BUPIVACAINE-EPINEPHRINE PF 0.5-1:200000 % IJ SOLN
INTRAMUSCULAR | Status: DC | PRN
  Administered 2011-11-10: 30 mL

## 2011-11-10 MED ORDER — EPHEDRINE SULFATE 50 MG/ML IJ SOLN
INTRAMUSCULAR | Status: DC | PRN
  Administered 2011-11-10: 10 mg via INTRAVENOUS

## 2011-11-10 MED ORDER — DEXAMETHASONE SODIUM PHOSPHATE 10 MG/ML IJ SOLN
INTRAMUSCULAR | Status: DC | PRN
  Administered 2011-11-10: 4 mg via INTRAVENOUS

## 2011-11-10 MED ORDER — CEPHALEXIN 500 MG PO CAPS: 500.0000 mg | ORAL_CAPSULE | Freq: Three times a day (TID) | ORAL | Status: AC

## 2011-11-10 MED ORDER — LIDOCAINE HCL (CARDIAC) 20 MG/ML IV SOLN
INTRAVENOUS | Status: DC | PRN
  Administered 2011-11-10: 50 mg via INTRAVENOUS

## 2011-11-10 SURGICAL SUPPLY — 55 items
BANDAGE ADHESIVE 1X3 (GAUZE/BANDAGES/DRESSINGS) IMPLANT
BANDAGE CONFORM 3  STR LF (GAUZE/BANDAGES/DRESSINGS) IMPLANT
BANDAGE ELASTIC 3 VELCRO ST LF (GAUZE/BANDAGES/DRESSINGS) ×2 IMPLANT
BANDAGE GAUZE ELAST BULKY 4 IN (GAUZE/BANDAGES/DRESSINGS) ×4 IMPLANT
BLADE MINI RND TIP GREEN BEAV (BLADE) ×2 IMPLANT
BLADE SURG 15 STRL LF DISP TIS (BLADE) ×1 IMPLANT
BLADE SURG 15 STRL SS (BLADE) ×1
BNDG ELASTIC 2 VLCR STRL LF (GAUZE/BANDAGES/DRESSINGS) ×2 IMPLANT
BNDG ESMARK 4X9 LF (GAUZE/BANDAGES/DRESSINGS) ×2 IMPLANT
BRUSH SCRUB EZ PLAIN DRY (MISCELLANEOUS) ×2 IMPLANT
CLOTH BEACON ORANGE TIMEOUT ST (SAFETY) ×2 IMPLANT
CORDS BIPOLAR (ELECTRODE) ×2 IMPLANT
COVER MAYO STAND STRL (DRAPES) ×2 IMPLANT
COVER TABLE BACK 60X90 (DRAPES) ×2 IMPLANT
CUFF TOURNIQUET SINGLE 18IN (TOURNIQUET CUFF) ×2 IMPLANT
DECANTER SPIKE VIAL GLASS SM (MISCELLANEOUS) IMPLANT
DRAPE EXTREMITY T 121X128X90 (DRAPE) ×2 IMPLANT
DRAPE SURG 17X23 STRL (DRAPES) ×2 IMPLANT
DRAPE U 20/CS (DRAPES) IMPLANT
DRSG EMULSION OIL 3X3 NADH (GAUZE/BANDAGES/DRESSINGS) ×4 IMPLANT
GLOVE BIO SURGEON STRL SZ 6.5 (GLOVE) ×2 IMPLANT
GLOVE BIOGEL M STRL SZ7.5 (GLOVE) ×2 IMPLANT
GLOVE EXAM NITRILE EXT CUFF MD (GLOVE) ×4 IMPLANT
GLOVE ORTHO TXT STRL SZ7.5 (GLOVE) ×2 IMPLANT
GOWN BRE IMP PREV XXLGXLNG (GOWN DISPOSABLE) ×4 IMPLANT
GOWN PREVENTION PLUS XLARGE (GOWN DISPOSABLE) ×2 IMPLANT
LOOP VESSEL MAXI BLUE (MISCELLANEOUS) IMPLANT
NDL SAFETY ECLIPSE 18X1.5 (NEEDLE) ×1 IMPLANT
NEEDLE 27GAX1X1/2 (NEEDLE) IMPLANT
NEEDLE HYPO 18GX1.5 SHARP (NEEDLE) ×1
NEEDLE HYPO 25X1 1.5 SAFETY (NEEDLE) IMPLANT
NS IRRIG 1000ML POUR BTL (IV SOLUTION) ×2 IMPLANT
PACK BASIN DAY SURGERY FS (CUSTOM PROCEDURE TRAY) ×2 IMPLANT
PAD CAST 3X4 CTTN HI CHSV (CAST SUPPLIES) ×1 IMPLANT
PADDING CAST ABS 4INX4YD NS (CAST SUPPLIES)
PADDING CAST ABS COTTON 4X4 ST (CAST SUPPLIES) IMPLANT
PADDING CAST COTTON 3X4 STRL (CAST SUPPLIES) ×1
PADDING UNDERCAST 2  STERILE (CAST SUPPLIES) ×2 IMPLANT
SLING ARM FOAM STRAP XLG (SOFTGOODS) ×2 IMPLANT
SPLINT PLASTER CAST XFAST 3X15 (CAST SUPPLIES) ×10 IMPLANT
SPLINT PLASTER XTRA FASTSET 3X (CAST SUPPLIES) ×10
SPONGE GAUZE 4X4 12PLY (GAUZE/BANDAGES/DRESSINGS) ×2 IMPLANT
STOCKINETTE 4X48 STRL (DRAPES) ×2 IMPLANT
STRIP CLOSURE SKIN 1/2X4 (GAUZE/BANDAGES/DRESSINGS) IMPLANT
SUT ETHILON 5 0 P 3 18 (SUTURE) ×4
SUT NYLON ETHILON 5-0 P-3 1X18 (SUTURE) ×4 IMPLANT
SUT SILK 4 0 PS 2 (SUTURE) ×2 IMPLANT
SUT VIC AB 4-0 P2 18 (SUTURE) IMPLANT
SYR 3ML 23GX1 SAFETY (SYRINGE) IMPLANT
SYR BULB 3OZ (MISCELLANEOUS) ×2 IMPLANT
SYR CONTROL 10ML LL (SYRINGE) IMPLANT
TOWEL OR 17X24 6PK STRL BLUE (TOWEL DISPOSABLE) ×4 IMPLANT
TRAY DSU PREP LF (CUSTOM PROCEDURE TRAY) ×2 IMPLANT
UNDERPAD 30X30 INCONTINENT (UNDERPADS AND DIAPERS) ×2 IMPLANT
WATER STERILE IRR 1000ML POUR (IV SOLUTION) IMPLANT

## 2011-11-10 NOTE — Anesthesia Preprocedure Evaluation (Addendum)
Anesthesia Evaluation  Patient identified by MRN, date of birth, ID band Patient awake    Reviewed: Allergy & Precautions, H&P , NPO status , Patient's Chart, lab work & pertinent test results  History of Anesthesia Complications Negative for: history of anesthetic complications  Airway Mallampati: I TM Distance: >3 FB Neck ROM: Full    Dental No notable dental hx. (+) Teeth Intact and Dental Advisory Given   Pulmonary asthma (childhood asthma only) , former smoker (quit 15 years) + rhonchi   Pulmonary exam normal       Cardiovascular hypertension, Pt. on medications and Pt. on home beta blockers + CAD (cath 10/16/11: moderate LAD disease, normal LVF) Rhythm:Regular Rate:Normal     Neuro/Psych negative neurological ROS     GI/Hepatic negative GI ROS, Neg liver ROS,   Endo/Other  Morbid obesity  Renal/GU negative Renal ROS     Musculoskeletal negative musculoskeletal ROS (+)   Abdominal (+) + obese,   Peds  Hematology negative hematology ROS (+)   Anesthesia Other Findings   Reproductive/Obstetrics                          Anesthesia Physical Anesthesia Plan  ASA: III  Anesthesia Plan: General   Post-op Pain Management:    Induction: Intravenous  Airway Management Planned: LMA  Additional Equipment:   Intra-op Plan:   Post-operative Plan:   Informed Consent: I have reviewed the patients History and Physical, chart, labs and discussed the procedure including the risks, benefits and alternatives for the proposed anesthesia with the patient or authorized representative who has indicated his/her understanding and acceptance.   Dental advisory given  Plan Discussed with: CRNA and Surgeon  Anesthesia Plan Comments: (Plan routine monitors, GA- LMA OK, axillary block for post op analgesia)        Anesthesia Quick Evaluation

## 2011-11-10 NOTE — Discharge Instructions (Signed)
[facility] Center Instructions Hand Surgery  Wound Care: Keep your hand elevated above the level of your heart.  Do not allow it to dangle  by your side.  Keep the dressing dry and do not remove it unless your doctor advises you to do so.  He will usually change it at the time of your post-op visit.  Moving your fingers is advised to stimulate circulation but will depend on the site of your surgery.  If you have a splint applied, your doctor will advise you regarding movement.  Activity: Do not drive or operate machinery today.  Rest today and then you may return to your normal activity and work as indicated by your physician.  Diet:  Drink liquids today or eat a light diet.  You may resume a regular diet tomorrow.    General expectations: Pain for two to three days. Fingers may become slightly swollen.  Call your doctor if any of the following occur: Severe pain not relieved by pain medication. Elevated temperature. Dressing soaked with blood. Inability to move fingers. White or bluish color to fingers.  Regional Anesthesia Blocks  1. Numbness or the inability to move the "blocked" extremity may last from 3-48 hours after placement. The length of time depends on the medication injected and your individual response to the medication. If the numbness is not going away after 48 hours, call your surgeon.  2. The extremity that is blocked will need to be protected until the numbness is gone and the  Strength has returned. Because you cannot feel it, you will need to take extra care to avoid injury. Because it may be weak, you may have difficulty moving it or using it. You may not know what position it is in without looking at it while the block is in effect.  3. For blocks in the legs and feet, returning to weight bearing and walking needs to be done carefully. You will need to wait until the numbness is entirely gone and the strength has returned. You should be able to move your leg and foot  normally before you try and bear weight or walk. You will need someone to be with you when you first try to ensure you do not fall and possibly risk injury.  4. Bruising and tenderness at the needle site are common side effects and will resolve in a few days.  5. Persistent numbness or new problems with movement should be communicated to the surgeon or the [facility] ([phone]).    [facility]  [address] , [place] [address] [phone]   Post Anesthesia Home Care Instructions  Activity: Get plenty of rest for the remainder of the day. A responsible adult should stay with you for 24 hours following the procedure.  For the next 24 hours, DO NOT: -Drive a car -Operate machinery -Drink alcoholic beverages -Take any medication unless instructed by your physician -Make any legal decisions or sign important papers.  Meals: Start with liquid foods such as gelatin or soup. Progress to regular foods as tolerated. Avoid greasy, spicy, heavy foods. If nausea and/or vomiting occur, drink only clear liquids until the nausea and/or vomiting subsides. Call your physician if vomiting continues.  Special Instructions/Symptoms: Your throat may feel dry or sore from the anesthesia or the breathing tube placed in your throat during surgery. If this causes discomfort, gargle with warm salt water. The discomfort should disappear within 24 hours.   

## 2011-11-10 NOTE — Brief Op Note (Signed)
11/10/2011  2:00 PM  PATIENT:  Austin Keller  66 y.o. male  PRE-OPERATIVE DIAGNOSIS:  dupuytrens left hand, complex involving long, ring, small and thumb index web  POST-OPERATIVE DIAGNOSIS:  dupuytrens left hand, complex involving long, ring small and thumb index web  PROCEDURE:  DUPUYTREN CONTRACTURE / PALMAR FIBROMATOSIS EXCISION LEFT PALM, SMALL, RING ,LONG AND THUMB INDEX WEB RELEASE   SURGEON: Dontrez Pettis JR,Willamae Demby V       PHYSICIAN ASSISTANT:   ASSISTANTS: Shanara Schnieders Dasnoit,P.A-C   ANESTHESIA:   general  EBL:  Total I/O In: 500 [I.V.:500] Out: -   BLOOD ADMINISTERED:none  DRAINS: none   LOCAL MEDICATIONS USED:  LEFT PLEXUS BLOCK WITH ROPIVICAINE  SPECIMEN:  Biopsy / Limited Resection  DISPOSITION OF SPECIMEN:  PATHOLOGY  COUNTS:  YES  TOURNIQUET:   Total Tourniquet Time Documented: Upper Arm (Left) - 104 minutes  DICTATION: .Other Dictation: Dictation Number [id number]  PLAN OF CARE: Discharge to home after PACU  PATIENT DISPOSITION:  PACU - hemodynamically stable.

## 2011-11-10 NOTE — Anesthesia Procedure Notes (Addendum)
Anesthesia Regional Block:  Axillary brachial plexus block  Pre-Anesthetic Checklist: ,, timeout performed, Correct Patient, Correct Site, Correct Laterality, Correct Procedure, Correct Position, site marked, Risks and benefits discussed,  Surgical consent,  Pre-op evaluation,  At surgeon's request and post-op pain management  Laterality: Left  Prep: chloraprep       Needles:  Injection technique: Single-shot  Needle Type: Other   (#22ga short "B" bevel needle)    Needle Gauge: 22 and 22 G    Additional Needles:  Procedures: paresthesia technique Axillary brachial plexus block  Nerve Stimulator or Paresthesia:  Response: transient median nerve paresthesia,  Response: transient ulnar nerve paresthesia,   Additional Responses:   Narrative:  Start time: 11/10/2011 11:41 AM End time: 11/10/2011 11:50 AM Injection made incrementally with aspirations every 5 mL.  Performed by: Personally  Anesthesiologist: Sandford Craze, MD  Additional Notes: Pt identified in Holding room.  Monitors applied. Working IV access confirmed. Sterile prep L axilla.  #22ga "B" bevel to transient median nerve and transient ulnar nerve paresthesiae.  Total 30cc 0.5% Bupivacaine with 1:200k epi, and 25cc 1.5% lidocaine with 1:200k epi injected incrementally, distributed around each paresthesia, after negative test dose.  Patient asymptomatic, VSS, no heme aspirated, tolerated well.   Sandford Craze, MD   Procedure Name: LMA Insertion Date/Time: 11/10/2011 10:21 AM Performed by: Caren Macadam Pre-anesthesia Checklist: Patient identified, Emergency Drugs available, Suction available and Patient being monitored Patient Re-evaluated:Patient Re-evaluated prior to inductionOxygen Delivery Method: Circle System Utilized Preoxygenation: Pre-oxygenation with 100% oxygen Intubation Type: IV induction Ventilation: Mask ventilation without difficulty LMA: LMA with gastric port inserted LMA Size: 5.0 Number of attempts:  1 Airway Equipment and Method: bite block Placement Confirmation: positive ETCO2 and breath sounds checked- equal and bilateral Tube secured with: Tape Dental Injury: Teeth and Oropharynx as per pre-operative assessment

## 2011-11-10 NOTE — Op Note (Signed)
OP NOTE DICTATED 11/10/11 [id number]

## 2011-11-10 NOTE — Transfer of Care (Signed)
Immediate Anesthesia Transfer of Care Note  Patient: Austin Keller  Procedure(s) Performed: Procedure(s) (LRB): DUPUYTREN CONTRACTURE RELEASE (Left)  Patient Location: PACU  Anesthesia Type: GA combined with regional for post-op pain  Level of Consciousness: awake, alert , oriented and patient cooperative  Airway & Oxygen Therapy: Patient Spontanous Breathing and Patient connected to face mask oxygen  Post-op Assessment: Report given to PACU RN and Post -op Vital signs reviewed and stable  Post vital signs: Reviewed and stable  Complications: No apparent anesthesia complications

## 2011-11-10 NOTE — H&P (Signed)
Austin Keller is an 66 y.o. male.   Chief Complaint: Complaining of chronic and progressive Dupuytren's contracture involving the left hand HPI: Patient is a 66 year old right-hand-dominant male who has a long-standing history of Dupuytren's contracture involving the left hand. He was initially seen in June of 2010 for evaluation. At that time he related that he had had a prior partial resection of the palmar fascia by an out of town physician. He had recurrence of the Dupuytren's contracture of the left hand involving the long, ring, and small fingers. At that time. We advised him to simply watch the progression of the hand. He presented again in November of 2012, stating that he now has significant dysfunction with the hand and would like to proceed with surgical intervention.  Past Medical History  Diagnosis Date  . Hyperlipidemia   . Dupuytren's contracture of hand 08/2011    left  . Speech disorder   . Hypertension     under control; has been on med. x 2-3 yrs.  Marland Kitchen CAD (coronary artery disease) 10/02/11    Moderate LAD disease with calcification. No obstructive CAD. LV function was normal.   . Asthma     as a child    Past Surgical History  Procedure Date  . Tonsillectomy   . Hand surgery     left  . Colonoscopy   . Cardiac catheterization 10/02/2011    Moderate LAD disease with calcification. No obstructive CAD. Normal LV function.    Family History  Problem Relation Age of Onset  . Hip fracture Mother   . Ulcers Father     gastric   Social History:  reports that he has quit smoking. He has never used smokeless tobacco. He reports that he drinks alcohol. He reports that he does not use illicit drugs.  Allergies: No Known Allergies  Medications Prior to Admission  Medication Dose Route Frequency Provider Last Rate Last Dose  . aspirin 81 MG chewable tablet           . chlorhexidine (HIBICLENS) 4 % liquid 4 application  60 mL Topical Once       . chlorhexidine (HIBICLENS) 4  % liquid 4 application  60 mL Topical Once Wyn Forster., MD      . diazepam (VALIUM) 5 MG tablet           . lactated ringers infusion   Intravenous Continuous Hart Robinsons, MD       Medications Prior to Admission  Medication Sig Dispense Refill  . amLODipine (NORVASC) 5 MG tablet Take 1 tablet (5 mg total) by mouth daily.  90 tablet  3  . BENICAR 20 MG tablet TAKE 1 TABLET EVERY DAY  90 tablet  3  . cholecalciferol (VITAMIN D) 400 UNITS TABS Take by mouth.      . fish oil-omega-3 fatty acids 1000 MG capsule Take 2 g by mouth daily.      . simvastatin (ZOCOR) 20 MG tablet Take 1 tablet (20 mg total) by mouth at bedtime.  90 tablet  3    No results found for this or any previous visit (from the past 48 hour(s)).  No results found.   Pertinent items are noted in HPI.  There were no vitals taken for this visit.  General appearance: alert Head: Normocephalic, without obvious abnormality Neck: supple, symmetrical, trachea midline Resp: clear to auscultation bilaterally Cardio: regular rate and rhythm, S1, S2 normal, no murmur, click, rub or gallop GI: normal findings:  bowel sounds normal Extremities: Examination of the left hand revealed significant Dupuytren's contracture involving the pretendinous cords to the long, ring, and small finger with a and early cord in the thumb, index webspace. He has a 70 of flexion contracture of the left small finger MP and 40 PIP flexion contracture. Neurovascularly, the fingers are intact. He does have full further flexion. Pulses: 2+ and symmetric Skin: normal Neurologic: Grossly normal   Assessment/Plan Impression: Dupuytren's contracture involving the left hand pretendinous fibers to the left long, ring, and small finger with early cord development in the thumb, index webspace.  Plan: Patient to be taken to the operating room to undergo resection of Dupuytren's contracture left hand involving the long, ring, and small finger and  thumb, index, web space. The procedure risks, benefits, and postoperative course were discussed with the patient at length and he was in agreement with this plan.  DASNOIT,Mykah Bellomo J 11/10/2011, 10:30 AM    H&P documentation: 11/10/2011  -History and Physical Reviewed  -Patient has been re-examined  -No change in the plan of care  Wyn Forster, MD

## 2011-11-10 NOTE — Anesthesia Postprocedure Evaluation (Signed)
  Anesthesia Post-op Note  Patient: Austin Keller  Procedure(s) Performed: Procedure(s) (LRB): DUPUYTREN CONTRACTURE RELEASE (Left)  Patient Location: PACU  Anesthesia Type: GA combined with regional for post-op pain  Level of Consciousness: awake, alert  and oriented  Airway and Oxygen Therapy: Patient Spontanous Breathing  Post-op Pain: none  Post-op Assessment: Post-op Vital signs reviewed, Patient's Cardiovascular Status Stable, Respiratory Function Stable, Patent Airway, No signs of Nausea or vomiting and Pain level controlled  Post-op Vital Signs: Reviewed and stable  Complications: No apparent anesthesia complications

## 2011-11-10 NOTE — Progress Notes (Signed)
Assisted Dr. Jackson with left, axillary block. Side rails up, monitors on throughout procedure. See vital signs in flow sheet. Tolerated Procedure well. 

## 2011-11-11 NOTE — Op Note (Signed)
NAME:  Austin Keller, Austin Keller                     ACCOUNT NO.:  MEDICAL RECORD NO.:  [id number]  LOCATION:                                 FACILITY:  PHYSICIAN:  Katy Fitch. Weylin Plagge, M.D. DATE OF BIRTH:  [birth year]  DATE OF PROCEDURE:  11/10/2011 DATE OF DISCHARGE:                              OPERATIVE REPORT   PREOPERATIVE DIAGNOSES:  Complex Dupuytren palmar fibromatosis with contractures, left hand, involving palm; long, ring, and small finger; and significant thumb index webspace natatory ligament contracture.  POSTOPERATIVE DIAGNOSES:  Complex Dupuytren palmar fibromatosis with contractures, left hand, involving palm; long, ring, and small finger; and significant thumb index webspace natatory ligament contracture.  OPERATION: 1. Resection of complex palmar fibromatosis contracture of left hand,     including palm and small finger. 2. Palm and ring finger. 3. Palm and long finger. 4. Thumb index webspace with extensive V-Y advancement flaps and skin     resection in small finger ray.  OPERATING SURGEON:  Katy Fitch. Ilya Neely, MD.  ASSISTANT:  Marveen Reeks Dasnoit, PA.  ANESTHESIA:  General by LMA, supplemented by a left plexus block.  SUPERVISING ANESTHESIOLOGIST:  Germaine Pomfret, MD.  INDICATIONS:  Austin Keller is a 66 year old gentleman referred through the courtesy of Dr. Thomos Lemons of [facility] for evaluation and management of a complex left hand Dupuytren palmar fibromatosis.  Mr. Matsuura has multiple background medical problems including abnormal glucose tolerance, coronary artery disease, B12 deficiency, and chronic sinusitis.  We advised him to consider resection of his complex disease.  He is not a candidate for needle aponeurotomy nor collagenase injection.  Preoperatively, he was reminded of potential risks and benefits of surgery.  He understands the basic risks of infection, anesthetic complication, possible neurovascular injury.  We also discussed  the biological behavior of Dupuytren contracture, which is relentless.  He will develop more fibromatosis.  He may develop keloid scars.  Overtime, he will develop recurrent flexion contractures.  He understands that this is a palliative effort to improve his hand function by allowing him to open his palm up and get his hand flat on the table.  He understands he will require postoperative splinting and therapy.  Questions were invited and answered in detail.  PROCEDURE:  Json Koelzer was brought to room 2 of the [facility] and placed in supine position on the operating table.  In the holding area, Dr. Jean Rosenthal provided detailed anesthesia informed consent and placed a ropivacaine plexus block.  Mr. Vale had excellent anesthesia of the left upper extremity.  In room 2 under Dr. Edison Pace direct supervision, general anesthesia by LMA technique was induced followed by routine Betadine scrub and paint of the left upper extremity.  Following routine surgical time-out and administration of Ancef as an IV prophylactic antibiotic, the left hand was exsanguinated with Esmarch bandage and arterial tourniquet inflated to 220 mmHg.  Procedure commenced with planning of extensive Brunner zig- zag incisions, which will be converted to V-Y advancement flaps and rotation flaps to normalize the skin of the small finger.  The skin flaps were elevated meticulously revealing very complex Dupuytren infiltration of the dermis throughout the  full length of the small finger.  A very difficult dissection was completed from the MP joint of the small finger to the diaphysis of the distal phalanx with extensive involvement of the ulnar neurovascular bundle.  The ulnar neurovascular bundle was skeletonized from the base of the proximal phalanx to the proximal metaphysis of the distal phalanx. Similar dissection along the radial aspect of the small finger fully relieved the contracture of the MP and  IP joints.  Attention was then directed to removal of the pretendinous fibers to the long, ring, and small fingers on the palm followed by removal of disease at the base of the ring finger, at the base of the long finger, the natatory ligaments between the ring and small fingers, and finally through an incision at the 1st web, removal of the natatory ligaments between the thumb and index fingers.  After completion of the dissection, the MP joints and IP joints of all fingers were fully extended to neutral position.  The tourniquet was released with immediate capillary refill to the thumb, index, long, and ring fingers and refill to the small finger within 30 seconds.  The skin flaps were all viable.  Bleeding points were electrocauterized with bipolar current followed by meticulous repair of the flaps with corner sutures of 5-0 nylon and simple sutures of 5-0 nylon.  Extensive skin rearrangement of small finger was accomplished with V-Y advancement flaps and rotation flaps creating a Brunner zig-zag incision with proper skin contour of the small fingers across the proximal and PIP flexion creases.  The other Brunner incisions were simply repaired primarily with corner sutures of 5-0 nylon and simple sutures of 5-0 nylon.  Mr. Winkowski tolerated the procedure well.  He was placed in compressive dressing with Silvadene, Adaptic, sterile gauze, sterile Kerlix, sterile Webril, and a volar plaster splint maintaining the fingers in full extension.  For aftercare, he is provided prescriptions for Percocet 5 mg 1 or 2 tablets p.o. q.4-6 h. p.r.n. pain,  30 tabs without refill; also Keflex 500 mg 1 p.o. q.8 h. x4 days as a prophylactic antibiotic.     Katy Fitch Ltanya Bayley, M.D.     RVS/MEDQ  D:  11/10/2011  T:  11/11/2011  Job:  [id number]

## 2011-11-12 ENCOUNTER — Encounter (HOSPITAL_BASED_OUTPATIENT_CLINIC_OR_DEPARTMENT_OTHER): Payer: Self-pay | Admitting: Orthopedic Surgery

## 2012-02-18 ENCOUNTER — Ambulatory Visit (INDEPENDENT_AMBULATORY_CARE_PROVIDER_SITE_OTHER): Payer: 59 | Admitting: Cardiology

## 2012-02-18 ENCOUNTER — Encounter: Payer: Self-pay | Admitting: Cardiology

## 2012-02-18 VITALS — BP 120/70 | HR 76 | Ht 75.0 in | Wt 260.0 lb

## 2012-02-18 DIAGNOSIS — I10 Essential (primary) hypertension: Secondary | ICD-10-CM

## 2012-02-18 DIAGNOSIS — I2510 Atherosclerotic heart disease of native coronary artery without angina pectoris: Secondary | ICD-10-CM

## 2012-02-18 DIAGNOSIS — I119 Hypertensive heart disease without heart failure: Secondary | ICD-10-CM

## 2012-02-18 DIAGNOSIS — E785 Hyperlipidemia, unspecified: Secondary | ICD-10-CM

## 2012-02-18 NOTE — Patient Instructions (Addendum)
Add Aspirin 81 mg daily  Your physician wants you to follow-up in:6 months  You will receive a reminder letter in the mail two months in advance. If you don't receive a letter, please call our office to schedule the follow-up appointment.

## 2012-02-18 NOTE — Assessment & Plan Note (Signed)
The patient is on simvastatin 20 mg daily.  His lipids are followed by his primary care provider

## 2012-02-18 NOTE — Progress Notes (Signed)
Austin Keller Date of Birth:  10/20/1945 [facility] [address] [place], [place]  [address] [phone]         Fax   [phone]  History of Present Illness: This pleasant 66 year old gentleman is seen for a scheduled followup office visit.  We saw him initially in January for preoperative cardiac clearance he clearance for surgery on his left knee.  At the time of his preoperative EKG had a lot of PVCs.  He was noted to have multiple risk factors for premature coronary disease.  He had an abnormal stress test which led to a cardiac catheterization.  His catheterization showed moderate LAD disease without obstruction and he had normal LV function.  He  went ahead and had successful surgery by Dr. Teressa Senter.  He has had several months of hand rehabilitation and is scheduled to play golf for the first time this weekend.  The cardiac standpoint has been doing well with no chest pain or shortness of breath. Current Outpatient Prescriptions  Medication Sig Dispense Refill  . amLODipine (NORVASC) 5 MG tablet Take 1 tablet (5 mg total) by mouth daily.  90 tablet  3  . aspirin 81 MG tablet Take 81 mg by mouth daily.      Marland Kitchen BENICAR 20 MG tablet TAKE 1 TABLET EVERY DAY  90 tablet  3  . cholecalciferol (VITAMIN D) 400 UNITS TABS Take by mouth.      . Cyanocobalamin (VITAMIN B 12 PO) Take by mouth daily.      . fish oil-omega-3 fatty acids 1000 MG capsule Take 1 g by mouth daily.       . simvastatin (ZOCOR) 20 MG tablet Take 1 tablet (20 mg total) by mouth at bedtime.  90 tablet  3  . vitamin C (ASCORBIC ACID) 500 MG tablet Take 500 mg by mouth daily.        No Known Allergies  Patient Active Problem List  Diagnosis  . HYPERLIPIDEMIA  . OBESITY  . TINNITUS  . HYPERTENSION  . ALLERGIC RHINITIS  . PARONYCHIA, FINGER  . CONTACT DERMATITIS  . DUPUYTREN'S CONTRACTURE, LEFT  . FOOT PAIN  . OTHER ABNORMAL GLUCOSE  . SINUSITIS, CHRONIC  . Rash  . B12 deficiency  .  Preventative health care  . CAD (coronary artery disease)    History  Smoking status  . Former Smoker  Smokeless tobacco  . Never Used  Comment: quit smoking 12-15 yrs. ago    History  Alcohol Use  . 0.0 oz/week    2 drinks/day    Family History  Problem Relation Age of Onset  . Hip fracture Mother   . Ulcers Father     gastric    Review of Systems: Constitutional: no fever chills diaphoresis or fatigue or change in weight.  Head and neck: no hearing loss, no epistaxis, no photophobia or visual disturbance. Respiratory: No cough, shortness of breath or wheezing. Cardiovascular: No chest pain peripheral edema, palpitations. Gastrointestinal: No abdominal distention, no abdominal pain, no change in bowel habits hematochezia or melena. Genitourinary: No dysuria, no frequency, no urgency, no nocturia. Musculoskeletal:No arthralgias, no back pain, no gait disturbance or myalgias. Neurological: No dizziness, no headaches, no numbness, no seizures, no syncope, no weakness, no tremors. Hematologic: No lymphadenopathy, no easy bruising. Psychiatric: No confusion, no hallucinations, no sleep disturbance.    Physical Exam: Filed Vitals:   02/18/12 1505  BP: 120/70  Pulse: 76   the general appearance reveals a well-developed middle-aged  gentleman in no distress.The head and neck exam reveals pupils equal and reactive.  Extraocular movements are full.  There is no scleral icterus.  The mouth and pharynx are normal.  The neck is supple.  The carotids reveal no bruits.  The jugular venous pressure is normal.  The  thyroid is not enlarged.  There is no lymphadenopathy.  The chest is clear to percussion and auscultation.  There are no rales or rhonchi.  Expansion of the chest is symmetrical.  The precordium is quiet.  The first heart sound is normal.  The second heart sound is physiologically split.  There is no murmur gallop rub or click.  There is no abnormal lift or heave.  The abdomen  is soft and nontender.  The bowel sounds are normal.  The liver and spleen are not enlarged.  There are no abdominal masses.  There are no abdominal bruits.  Extremities reveal good pedal pulses.  There is no phlebitis or edema.  There is no cyanosis or clubbing.  Strength is normal and symmetrical in all extremities.  There is no lateralizing weakness.  There are no sensory deficits.  The skin is warm and dry.  There is no rash.  His left hand is healing well from the multiple incisions from his surgical repair.     Assessment / Plan: Patient is to continue same medication.  No restrictions on activity from the cardiac standpoint.  We are adding a 81 mg aspirin daily to his regimen because of his known coronary artery disease.  Check in 6 months for followup office visit and EKG

## 2012-02-18 NOTE — Assessment & Plan Note (Signed)
He is on Benicar and amlodipine for his blood pressure.  No side effects.  Blood pressure has been remaining stable.

## 2012-02-18 NOTE — Assessment & Plan Note (Signed)
The patient has not been experiencing any exertional chest pain or angina 

## 2012-02-23 ENCOUNTER — Other Ambulatory Visit: Payer: Self-pay | Admitting: Internal Medicine

## 2012-02-23 DIAGNOSIS — E538 Deficiency of other specified B group vitamins: Secondary | ICD-10-CM

## 2012-02-29 ENCOUNTER — Other Ambulatory Visit: Payer: Self-pay | Admitting: Internal Medicine

## 2012-02-29 ENCOUNTER — Other Ambulatory Visit (INDEPENDENT_AMBULATORY_CARE_PROVIDER_SITE_OTHER): Payer: 59

## 2012-02-29 DIAGNOSIS — E538 Deficiency of other specified B group vitamins: Secondary | ICD-10-CM

## 2012-02-29 DIAGNOSIS — E539 Vitamin B deficiency, unspecified: Secondary | ICD-10-CM | POA: Insufficient documentation

## 2012-02-29 LAB — CBC WITH DIFFERENTIAL/PLATELET
Basophils Absolute: 0.1 10*3/uL (ref 0.0–0.1)
Eosinophils Absolute: 0.7 10*3/uL (ref 0.0–0.7)
Lymphocytes Relative: 15.6 % (ref 12.0–46.0)
MCHC: 34 g/dL (ref 30.0–36.0)
Monocytes Relative: 10.6 % (ref 3.0–12.0)
Neutrophils Relative %: 63.5 % (ref 43.0–77.0)
Platelets: 231 10*3/uL (ref 150.0–400.0)
RDW: 13.8 % (ref 11.5–14.6)

## 2012-02-29 LAB — VITAMIN B12: Vitamin B-12: 1047 pg/mL — ABNORMAL HIGH (ref 211–911)

## 2012-03-02 LAB — INTRINSIC FACTOR ANTIBODIES: Intrinsic Factor: NEGATIVE

## 2012-03-04 ENCOUNTER — Telehealth: Payer: Self-pay | Admitting: Internal Medicine

## 2012-03-04 ENCOUNTER — Encounter: Payer: Self-pay | Admitting: Internal Medicine

## 2012-03-04 ENCOUNTER — Ambulatory Visit (INDEPENDENT_AMBULATORY_CARE_PROVIDER_SITE_OTHER): Payer: 59 | Admitting: Internal Medicine

## 2012-03-04 VITALS — BP 142/72 | HR 76 | Temp 97.8°F | Wt 263.0 lb

## 2012-03-04 DIAGNOSIS — I10 Essential (primary) hypertension: Secondary | ICD-10-CM

## 2012-03-04 DIAGNOSIS — I2510 Atherosclerotic heart disease of native coronary artery without angina pectoris: Secondary | ICD-10-CM

## 2012-03-04 DIAGNOSIS — E538 Deficiency of other specified B group vitamins: Secondary | ICD-10-CM

## 2012-03-04 DIAGNOSIS — J069 Acute upper respiratory infection, unspecified: Secondary | ICD-10-CM | POA: Insufficient documentation

## 2012-03-04 DIAGNOSIS — E785 Hyperlipidemia, unspecified: Secondary | ICD-10-CM

## 2012-03-04 MED ORDER — ATORVASTATIN CALCIUM 40 MG PO TABS: 40.0000 mg | ORAL_TABLET | Freq: Every day | ORAL | Status: DC

## 2012-03-04 NOTE — Assessment & Plan Note (Signed)
Stable.   No change in medication.  BP: 142/72 mmHg

## 2012-03-04 NOTE — Patient Instructions (Signed)
Please complete the following lab tests before your next follow up appointment: FLP, LFTs - 272.4 BMET - 401.9 

## 2012-03-04 NOTE — Telephone Encounter (Signed)
Future orders placed 

## 2012-03-04 NOTE — Telephone Encounter (Signed)
Patient while at check out requested to have his labs for his fu appt completed at [facility] due to the convenience. FLP,LFTS,272.4,BMET,401.9/ Please advise.

## 2012-03-04 NOTE — Progress Notes (Signed)
Subjective:    Patient ID: Austin Keller, male    DOB: 1946/06/23, 66 y.o.   MRN: [id number]  HPI  66 year old white male with history of hypertension and previously seen for Dupuytren's contracture for followup.  Preop testing revealed abnormal stress test. Patient underwent cardiac cath. This showed nonobstructive coronary disease.  He is now followed by cardiology (Dr. Patty Sermons).  Patient's left hand surgery was successful. He had some issues how long it took to complete rehabilitation.  He has regained function of left hand.  He is able to golf.  He complains mild sore throat at cough for last few days.  No fever or chills.  Review of Systems Negative for chest pain or SOB  Past Medical History  Diagnosis Date  . Hyperlipidemia   . Dupuytren's contracture of hand 08/2011    left  . Speech disorder   . Hypertension     under control; has been on med. x 2-3 yrs.  Marland Kitchen CAD (coronary artery disease) 10/02/11    Moderate LAD disease with calcification. No obstructive CAD. LV function was normal.   . Asthma     as a child    History   Social History  . Marital Status: Married    Spouse Name: N/A    Number of Children: 3  . 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 Smoking status: Former Games developer  . Smokeless tobacco: Never Used   Comment: quit smoking 12-15 yrs. ago  . Alcohol Use: 0.0 oz/week     2 drinks/day  . Drug Use: No  . Sexually Active: Not on file   Other Topics Concern  . Not on file   Social History Narrative   Wife is 7 yrs younger- works in [employer]    Past Surgical History  Procedure Date  . Tonsillectomy   . Hand surgery     left  . Colonoscopy   . Cardiac catheterization 10/02/2011    Moderate LAD disease with calcification. No obstructive CAD. Normal LV function.  . Dupuytren contracture release 11/10/2011    Procedure: DUPUYTREN CONTRACTURE RELEASE;  Surgeon: Wyn Forster., MD;  Location: [facility];  Service: Orthopedics;  Laterality: Left;  palm, long, ring, small, thumb and index web left hand     Family History  Problem Relation Age of Onset  . Hip fracture Mother   . Ulcers Father     gastric    No Known Allergies  Current Outpatient Prescriptions on File Prior to Visit  Medication Sig Dispense Refill  . amLODipine (NORVASC) 5 MG tablet Take 1 tablet (5 mg total) by mouth daily.  90 tablet  3  . aspirin 81 MG tablet Take 81 mg by mouth daily.      Marland Kitchen BENICAR 20 MG tablet TAKE 1 TABLET EVERY DAY  90 tablet  3  . cholecalciferol (VITAMIN D) 400 UNITS TABS Take by mouth.      . Cyanocobalamin (VITAMIN B 12 PO) Take by mouth daily.      . fish oil-omega-3 fatty acids 1000 MG capsule Take 1 g by mouth daily.       . vitamin C (ASCORBIC ACID) 500 MG tablet Take 500 mg by mouth daily.      Marland Kitchen atorvastatin (LIPITOR) 40 MG tablet Take 1 tablet (40 mg total) by mouth daily.  90 tablet  1    BP 142/72  Pulse 76  Temp 97.8 F (36.6 C) (Oral)  Wt 263 lb (119.296 kg)       Objective:   Physical Exam  Constitutional: He is oriented to person, place, and time. He appears well-developed and well-nourished.  HENT:  Head: Normocephalic and atraumatic.  Right Ear: External ear normal.  Left Ear: External ear normal.       Mild oropharyngeal erythema  Neck: Neck supple.       No carotid bruit  Cardiovascular: Normal rate, regular rhythm and normal heart sounds.   Pulmonary/Chest: Effort normal and breath sounds normal. He has no wheezes.  Neurological: He is alert and oriented to person, place, and time.  Skin:       Left hand scar well healed  Psychiatric: He has a normal mood and affect. His behavior is normal.       Assessment & Plan:

## 2012-03-04 NOTE — Assessment & Plan Note (Signed)
Patient likely has viral URI. He has mild oropharyngeal erythema. Chest is clear. Conservative treatment recommended.  Patient advised to call office if symptoms persist or worsen.

## 2012-03-04 NOTE — Assessment & Plan Note (Signed)
Goal LDL <70.  Change simvastatin to atorvastatin 40 mg. LFTs and fasting lipid profile before next office visit.

## 2012-03-04 NOTE — Assessment & Plan Note (Signed)
Resolved with oral replacement.  Patient to take B12 supplement weekly.

## 2012-05-25 ENCOUNTER — Other Ambulatory Visit (INDEPENDENT_AMBULATORY_CARE_PROVIDER_SITE_OTHER): Payer: 59

## 2012-05-25 DIAGNOSIS — E785 Hyperlipidemia, unspecified: Secondary | ICD-10-CM

## 2012-05-25 DIAGNOSIS — I10 Essential (primary) hypertension: Secondary | ICD-10-CM

## 2012-05-25 LAB — HEPATIC FUNCTION PANEL
ALT: 41 U/L (ref 0–53)
AST: 32 U/L (ref 0–37)
Total Protein: 7.4 g/dL (ref 6.0–8.3)

## 2012-05-25 LAB — BASIC METABOLIC PANEL
BUN: 13 mg/dL (ref 6–23)
Calcium: 9.1 mg/dL (ref 8.4–10.5)
GFR: 74.61 mL/min (ref >60.00)
Potassium: 4.1 mEq/L (ref 3.5–5.1)
Sodium: 141 mEq/L (ref 135–145)

## 2012-05-25 LAB — LIPID PANEL
Cholesterol: 147 mg/dL (ref 0–200)
HDL: 72.6 mg/dL (ref >39.00)
VLDL: 17.8 mg/dL (ref 0.0–40.0)

## 2012-05-30 ENCOUNTER — Encounter: Payer: Self-pay | Admitting: Internal Medicine

## 2012-05-30 ENCOUNTER — Ambulatory Visit (INDEPENDENT_AMBULATORY_CARE_PROVIDER_SITE_OTHER): Payer: 59 | Admitting: Internal Medicine

## 2012-05-30 VITALS — BP 132/68 | HR 76 | Temp 98.0°F | Wt 254.0 lb

## 2012-05-30 DIAGNOSIS — I10 Essential (primary) hypertension: Secondary | ICD-10-CM

## 2012-05-30 DIAGNOSIS — E785 Hyperlipidemia, unspecified: Secondary | ICD-10-CM

## 2012-05-30 MED ORDER — AMLODIPINE BESYLATE 5 MG PO TABS: 5.0000 mg | ORAL_TABLET | Freq: Every day | ORAL | Status: DC

## 2012-05-30 MED ORDER — OLMESARTAN MEDOXOMIL 20 MG PO TABS: 20.0000 mg | ORAL_TABLET | Freq: Every day | ORAL | Status: DC

## 2012-05-30 MED ORDER — ATORVASTATIN CALCIUM 40 MG PO TABS: 40.0000 mg | ORAL_TABLET | Freq: Every day | ORAL | Status: DC

## 2012-05-30 NOTE — Patient Instructions (Addendum)
Please complete the following lab tests before your next follow up appointment: BMET, FLP, LFTs, TSH, A1c - 401.9, 272.4, 790.29 PSA - V76.44

## 2012-05-30 NOTE — Assessment & Plan Note (Signed)
LDL at goal.  Continue atorvastatin. Lab Results  Component Value Date   CHOL 147 05/25/2012   HDL 72.60 05/25/2012   LDLCALC 57 05/25/2012   LDLDIRECT 100.7 08/21/2011   TRIG 89.0 05/25/2012   CHOLHDL 2 05/25/2012

## 2012-05-30 NOTE — Assessment & Plan Note (Signed)
Well controlled.  No change in BP medication. BP: 132/68 mmHg  Lab Results  Component Value Date   CREATININE 1.1 05/25/2012

## 2012-05-30 NOTE — Progress Notes (Signed)
Subjective:    Patient ID: Austin Keller, male    DOB: [birth year], 66 y.o.   MRN: [id number]  HPI  66 year old white male with history of hypertension, hyperlipidemia and nonobstructive coronary artery disease for followup. Overall patient has been doing very well. He has been staying active. He denies chest pain or shortness of breath. Lab results reviewed in detail. LDL well controlled. He is tolerating Lipitor without any side effects.  Review of Systems See HPI  Past Medical History  Diagnosis Date  . Hyperlipidemia   . Dupuytren's contracture of hand 08/2011    left  . Speech disorder   . Hypertension     under control; has been on med. x 2-3 yrs.  Marland Kitchen CAD (coronary artery disease) 10/02/11    Moderate LAD disease with calcification. No obstructive CAD. LV function was normal.   . Asthma     as a child    History   Social History  . Marital Status: Married    Spouse Name: N/A    Number of Children: 3  . 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 Smoking status: Former Games developer  . Smokeless tobacco: Never Used   Comment: quit smoking 12-15 yrs. ago  . Alcohol Use: 0.0 oz/week     2 drinks/day  . Drug Use: No  . Sexually Active: Not on file   Other Topics Concern  . Not on file   Social History Narrative   Wife is 7 yrs younger- works in [employer]    Past Surgical History  Procedure Date  . Tonsillectomy   . Hand surgery     left  . Colonoscopy   . Cardiac catheterization 10/02/2011    Moderate LAD disease with calcification. No obstructive CAD. Normal LV function.  . Dupuytren contracture release 11/10/2011    Procedure: DUPUYTREN CONTRACTURE RELEASE;  Surgeon: Wyn Forster., MD;  Location: [facility];  Service: Orthopedics;  Laterality: Left;  palm, long, ring, small, thumb and index web left hand     Family History  Problem Relation Age of Onset  . Hip fracture  Mother   . Ulcers Father     gastric    No Known Allergies  Current Outpatient Prescriptions on File Prior to Visit  Medication Sig Dispense Refill  . aspirin 81 MG tablet Take 81 mg by mouth daily.      . cholecalciferol (VITAMIN D) 400 UNITS TABS Take by mouth.      . Cyanocobalamin (VITAMIN B 12 PO) Take by mouth daily.      . fish oil-omega-3 fatty acids 1000 MG capsule Take 1 g by mouth daily.       . vitamin C (ASCORBIC ACID) 500 MG tablet Take 500 mg by mouth daily.      Marland Kitchen DISCONTD: amLODipine (NORVASC) 5 MG tablet Take 1 tablet (5 mg total) by mouth daily.  90 tablet  3  . DISCONTD: atorvastatin (LIPITOR) 40 MG tablet Take 1 tablet (40 mg total) by mouth daily.  90 tablet  1  . DISCONTD: BENICAR 20 MG tablet TAKE 1 TABLET EVERY DAY  90 tablet  3    BP 132/68  Pulse 76  Temp 98 F (36.7 C) (Oral)  Wt 254 lb (115.214 kg)       Objective:   Physical Exam  Constitutional: He is oriented to  person, place, and time. He appears well-developed and well-nourished.  Cardiovascular: Normal rate and regular rhythm.   Pulmonary/Chest: Effort normal and breath sounds normal. He has no wheezes.  Musculoskeletal: He exhibits no edema.  Neurological: He is alert and oriented to person, place, and time.  Psychiatric: He has a normal mood and affect. His behavior is normal.          Assessment & Plan:

## 2012-06-06 ENCOUNTER — Ambulatory Visit: Payer: 59 | Admitting: Internal Medicine

## 2012-06-21 ENCOUNTER — Ambulatory Visit (INDEPENDENT_AMBULATORY_CARE_PROVIDER_SITE_OTHER): Payer: 59

## 2012-06-21 DIAGNOSIS — Z23 Encounter for immunization: Secondary | ICD-10-CM

## 2012-06-22 DIAGNOSIS — Z23 Encounter for immunization: Secondary | ICD-10-CM

## 2012-09-12 ENCOUNTER — Other Ambulatory Visit: Payer: Self-pay | Admitting: Internal Medicine

## 2012-10-03 ENCOUNTER — Encounter: Payer: Self-pay | Admitting: Internal Medicine

## 2012-10-10 ENCOUNTER — Telehealth: Payer: Self-pay | Admitting: Internal Medicine

## 2012-10-10 ENCOUNTER — Encounter: Payer: Self-pay | Admitting: Internal Medicine

## 2012-10-10 NOTE — Telephone Encounter (Signed)
Caller: Murice/Patient; Phone: [phone]; Reason for Call: Rec'd letter from Dr Marina Goodell in [place], stating that he needed another Colonscopy.  Wants to know if Dr Artist Pais thinks it is necessary.  Jms/can

## 2012-10-10 NOTE — Telephone Encounter (Signed)
Pt aware.

## 2012-10-10 NOTE — Telephone Encounter (Signed)
Yes, follow recommendation of GI specialist

## 2012-12-06 ENCOUNTER — Ambulatory Visit (AMBULATORY_SURGERY_CENTER): Payer: 59

## 2012-12-06 VITALS — Ht 75.0 in | Wt 257.2 lb

## 2012-12-06 DIAGNOSIS — Z1211 Encounter for screening for malignant neoplasm of colon: Secondary | ICD-10-CM

## 2012-12-06 DIAGNOSIS — Z86010 Personal history of colonic polyps: Secondary | ICD-10-CM

## 2012-12-06 MED ORDER — MOVIPREP 100 G PO SOLR: ORAL | Status: DC

## 2012-12-07 ENCOUNTER — Encounter: Payer: Self-pay | Admitting: Internal Medicine

## 2012-12-19 ENCOUNTER — Encounter: Payer: Self-pay | Admitting: Internal Medicine

## 2012-12-19 ENCOUNTER — Ambulatory Visit (AMBULATORY_SURGERY_CENTER): Payer: 59 | Admitting: Internal Medicine

## 2012-12-19 VITALS — BP 112/67 | HR 68 | Temp 98.4°F | Resp 14 | Ht 75.0 in | Wt 257.0 lb

## 2012-12-19 DIAGNOSIS — Z86010 Personal history of colonic polyps: Secondary | ICD-10-CM

## 2012-12-19 DIAGNOSIS — Z1211 Encounter for screening for malignant neoplasm of colon: Secondary | ICD-10-CM

## 2012-12-19 DIAGNOSIS — D126 Benign neoplasm of colon, unspecified: Secondary | ICD-10-CM

## 2012-12-19 MED ORDER — SODIUM CHLORIDE 0.9 % IV SOLN: 500.0000 mL | INTRAVENOUS | Status: DC

## 2012-12-19 NOTE — Op Note (Signed)
[facility] [address] [place], [address]   COLONOSCOPY PROCEDURE REPORT  PATIENT: Austin Keller, Austin Keller  MR#: [id number] BIRTHDATE: 06-24-1946 , 66  yrs. old GENDER: Male ENDOSCOPIST: Roxy Cedar, MD REFERRED [id number] Program Recall PROCEDURE DATE:  12/19/2012 PROCEDURE:   Colonoscopy with snare polypectomy. Index 08-2007 with advanced adenoma(s) ASA CLASS:   Class II INDICATIONS:Patient's personal history of adenomatous colon polyps.  MEDICATIONS: propofol (Diprivan) 300mg  IV and MAC sedation, administered by CRNA  DESCRIPTION OF PROCEDURE:   After the risks benefits and alternatives of the procedure were thoroughly explained, informed consent was obtained.  A digital rectal exam revealed no abnormalities of the rectum.   The LB [id number]  endoscope was introduced through the anus and advanced to the cecum, which was identified by both the appendix and ileocecal valve. No adverse events experienced.   The quality of the prep was excellent, using MoviPrep  The instrument was then slowly withdrawn as the colon was fully examined.      COLON FINDINGS: A diminutive polyp was found in the ascending colon. A polypectomy was performed with a cold snare.  The resection was complete and the polyp tissue was completely retrieved.   The colon mucosa was otherwise normal.  Retroflexed views revealed internal hemorrhoids. The time to cecum=5 minutes 52 seconds.  Withdrawal time=9 minutes 10 seconds.  The scope was withdrawn and the procedure completed. COMPLICATIONS: There were no complications.  ENDOSCOPIC IMPRESSION: 1.   Diminutive polyp was found in the ascending colon; polypectomy was performed with a cold snare 2.   The colon mucosa was otherwise normal  RECOMMENDATIONS: 1. Follow up colonoscopy in 5 years   eSigned:  Roxy Cedar, MD 12/19/2012 9:14 AM   cc: Thomos Lemons, DO, Hali Marry, MD, and Zelphia Cairo  MD

## 2012-12-19 NOTE — Patient Instructions (Addendum)
YOU HAD AN ENDOSCOPIC PROCEDURE TODAY AT THE [facility]: Refer to the procedure report that was given to you for any specific questions about what was found during the examination.  If the procedure report does not answer your questions, please call your gastroenterologist to clarify.  If you requested that your care partner not be given the details of your procedure findings, then the procedure report has been included in a sealed envelope for you to review at your convenience later.  YOU SHOULD EXPECT: Some feelings of bloating in the abdomen. Passage of more gas than usual.  Walking can help get rid of the air that was put into your GI tract during the procedure and reduce the bloating. If you had a lower endoscopy (such as a colonoscopy or flexible sigmoidoscopy) you may notice spotting of blood in your stool or on the toilet paper. If you underwent a bowel prep for your procedure, then you may not have a normal bowel movement for a few days.  DIET: Your first meal following the procedure should be a light meal and then it is ok to progress to your normal diet.  A half-sandwich or bowl of soup is an example of a good first meal.  Heavy or fried foods are harder to digest and may make you feel nauseous or bloated.  Likewise meals heavy in dairy and vegetables can cause extra gas to form and this can also increase the bloating.  Drink plenty of fluids but you should avoid alcoholic beverages for 24 hours.  ACTIVITY: Your care partner should take you home directly after the procedure.  You should plan to take it easy, moving slowly for the rest of the day.  You can resume normal activity the day after the procedure however you should NOT DRIVE or use heavy machinery for 24 hours (because of the sedation medicines used during the test).    SYMPTOMS TO REPORT IMMEDIATELY: A gastroenterologist can be reached at any hour.  During normal business hours, 8:30 AM to 5:00 PM Monday through Friday,  call [phone].  After hours and on weekends, please call the GI answering service at [phone] who will take a message and have the physician on call contact you.   Following lower endoscopy (colonoscopy or flexible sigmoidoscopy):  Excessive amounts of blood in the stool  Significant tenderness or worsening of abdominal pains  Swelling of the abdomen that is new, acute  Fever of 100F or higher   FOLLOW UP: If any biopsies were taken you will be contacted by phone or by letter within the next 1-3 weeks.  Call your gastroenterologist if you have not heard about the biopsies in 3 weeks.  Our staff will call the home number listed on your records the next business day following your procedure to check on you and address any questions or concerns that you may have at that time regarding the information given to you following your procedure. This is a courtesy call and so if there is no answer at the home number and we have not heard from you through the emergency physician on call, we will assume that you have returned to your regular daily activities without incident.  SIGNATURES/CONFIDENTIALITY: You and/or your care partner have signed paperwork which will be entered into your electronic medical record.  These signatures attest to the fact that that the information above on your After Visit Summary has been reviewed and is understood.  Full responsibility of the confidentiality of   this discharge information lies with you and/or your care-partner.  Polyp-handout given  Repeat colonoscopy in 5 years.   

## 2012-12-19 NOTE — Progress Notes (Signed)
Patient did not experience any of the following events: a burn prior to discharge; a fall within the facility; wrong site/side/patient/procedure/implant event; or a hospital transfer or hospital admission upon discharge from the facility. (G8907) Patient did not have preoperative order for IV antibiotic SSI prophylaxis. (G8918)  

## 2012-12-20 ENCOUNTER — Telehealth: Payer: Self-pay | Admitting: *Deleted

## 2012-12-20 NOTE — Telephone Encounter (Signed)
Name identifier, left message, follow-up 

## 2012-12-22 ENCOUNTER — Encounter: Payer: Self-pay | Admitting: Internal Medicine

## 2013-01-02 ENCOUNTER — Telehealth: Payer: Self-pay | Admitting: Internal Medicine

## 2013-01-02 ENCOUNTER — Telehealth: Payer: Self-pay | Admitting: *Deleted

## 2013-01-02 NOTE — Telephone Encounter (Signed)
Pt called and left message on my voicemail that yesterday he was having SOB and some chest pressure.  He stated it was so bad he almost asked his wife to take him to the ER but didn't.  Called pt and got his voicemail and told pt to go to the ER if still experiencing symptoms or call back and speak to triage nurse.  I told pt do NOT leave message on my voicemail cause I don't check it that often.

## 2013-01-02 NOTE — Telephone Encounter (Signed)
Patient Information:  Caller Name: Neema  Phone: [phone]  Patient: ,   Gender: Male  DOB: 1945-12-16  Age: 67 Years  PCP: Artist Pais Doe-Hyun Molly Maduro) (Adults only)  Office Follow Up:  Does the office need to follow up with this patient?: No  Instructions For The Office: N/A  RN Note:  Advised to call back immediately if has reoccurance of chest tightness.  Symptoms  Reason For Call & Symptoms: Calling about starting with hacking Cough onset 12/29/12 and had some shortness of breath  and chest tightness on 12/31/12 until this morning-01/02/13. This morning he coughed up glob of greenish mucos. Still has occasional dry cough but no other symptoms. Breathing is easy. Afebrile..  Reviewed Health History In EMR: Yes  Reviewed Medications In EMR: Yes  Reviewed Allergies In EMR: Yes  Reviewed Surgeries / Procedures: Yes  Date of Onset of Symptoms: 12/29/2012  Guideline(s) Used:  Breathing Difficulty  Cough  Disposition Per Guideline:   Home Care  Reason For Disposition Reached:   Cough with no complications  Advice Given:  Reassurance  Coughing is the way that our lungs remove irritants and mucus. It helps protect our lungs from getting pneumonia.  You can get a dry hacking cough after a chest cold. Sometimes this type of cough can last 1-3 weeks, and be worse at night.  You can also get a cough after being exposed to irritating substances like smoke, strong perfumes, and dust.  Here is some care advice that should help.  Cough Medicines:  OTC Cough Drops: Cough drops can help a lot, especially for mild coughs. They reduce coughing by soothing your irritated throat and removing that tickle sensation in the back of the throat. Cough drops also have the advantage of portability - you can carry them with you.  Home Remedy - Honey: This old home remedy has been shown to help decrease coughing at night. The adult dosage is 2 teaspoons (10 ml) at bedtime. Honey should not be given to infants  under one year of age.  Coughing Spasms:  Drink warm fluids. Inhale warm mist (Reason: both relax the airway and loosen up the phlegm).  Suck on cough drops or hard candy to coat the irritated throat.  Prevent Dehydration:  Drink adequate liquids.  This will help soothe an irritated or dry throat and loosen up the phlegm.  Avoid Tobacco Smoke:  Smoking or being exposed to smoke makes coughs much worse.  Expected Course:   The expected course depends on what is causing the cough.  Viral bronchitis (chest cold) causes a cough that lasts 1 to 3 weeks. Sometimes you may cough up lots of phlegm (sputum, mucus). The mucus can normally be white, gray, yellow, or green.  Call Back If:  Difficulty breathing  Cough lasts more than 3 weeks  Fever lasts > 3 days  You become worse.  General Care Advice for Breathing Difficulty:  Find position of greatest comfort. For most patients the best position is semi-upright (e.g., sitting up in a comfortable chair or lying back against pillows).  Elevate head of bed (e.g., use pillows or place blocks under bed).  Avoid smoke or fume exposure.  Keep room temperature slightly on the cool side.  Use a humidifier.  Call Back If:  Severe difficulty breathing occurs  Fever more than 100.5 F (38.1 C)  You become worse.  Patient Will Follow Care Advice:  YES

## 2013-01-11 ENCOUNTER — Telehealth: Payer: Self-pay | Admitting: Internal Medicine

## 2013-01-11 NOTE — Telephone Encounter (Signed)
Patient Information:  Caller Name: Taisei  Phone: [phone]  Patient: Austin Keller, Austin Keller  Gender: Male  DOB: [birth year]  Age: 67 Years  PCP: Artist Pais Doe-Hyun Molly Maduro) (Adults only)  Office Follow Up:  Does the office need to follow up with this patient?: No  Instructions For The Office: N/A  RN Note:  History of asthma as a child.  Two weeks ago 01/02/13, called with symtoms like asthma with restricted breathing, tight chest. Wife reports some intermittent wheezing at night. Declined appointment for 01/11/13. Prefers appointment 01/12/13 with Dr Artist Pais.   Symptoms  Reason For Call & Symptoms: Persistent productive cough  Reviewed Health History In EMR: Yes  Reviewed Medications In EMR: Yes  Reviewed Allergies In EMR: Yes  Reviewed Surgeries / Procedures: Yes  Date of Onset of Symptoms: 12/25/2012  Treatments Tried: Mucinex, showers  Treatments Tried Worked: Yes  Guideline(s) Used:  Cough  Disposition Per Guideline:   See Within 3 Days in Office  Reason For Disposition Reached:   Cough has been present for > 10 days  Advice Given:  Reassurance  Coughing is the way that our lungs remove irritants and mucus. It helps protect our lungs from getting pneumonia.  You can get a dry hacking cough after a chest cold. Sometimes this type of cough can last 1-3 weeks, and be worse at night.  You can also get a cough after being exposed to irritating substances like smoke, strong perfumes, and dust.  Here is some care advice that should help.  Cough Medicines:  OTC Cough Syrups: The most common cough suppressant in OTC cough medications is dextromethorphan. Often the letters "DM" appear in the name.  Home Remedy - Honey: This old home remedy has been shown to help decrease coughing at night. The adult dosage is 2 teaspoons (10 ml) at bedtime. Honey should not be given to infants under one year of age.  Coughing Spasms:  Drink warm fluids. Inhale warm mist (Reason: both relax the airway and  loosen up the phlegm).  Suck on cough drops or hard candy to coat the irritated throat.  Prevent Dehydration:  Drink adequate liquids.  Call Back If:  Difficulty breathing  Cough lasts more than 3 weeks  Fever lasts > 3 days  You become worse.  Patient Will Follow Care Advice:  YES  Appointment Scheduled:  01/11/2013 14:15:00 Appointment Scheduled Provider:  Artist Pais, Doe-Hyun Molly Maduro) (Adults only)

## 2013-01-12 ENCOUNTER — Ambulatory Visit (INDEPENDENT_AMBULATORY_CARE_PROVIDER_SITE_OTHER)
Admission: RE | Admit: 2013-01-12 | Discharge: 2013-01-12 | Disposition: A | Discharge status: Home / Self Care | Payer: 59 | Source: Ambulatory Visit | Attending: Internal Medicine | Admitting: Internal Medicine

## 2013-01-12 ENCOUNTER — Encounter: Payer: Self-pay | Admitting: Internal Medicine

## 2013-01-12 ENCOUNTER — Telehealth: Payer: Self-pay | Admitting: *Deleted

## 2013-01-12 ENCOUNTER — Ambulatory Visit (INDEPENDENT_AMBULATORY_CARE_PROVIDER_SITE_OTHER): Payer: 59 | Admitting: Internal Medicine

## 2013-01-12 VITALS — BP 122/80 | HR 74 | Temp 98.4°F | Wt 258.0 lb

## 2013-01-12 DIAGNOSIS — R0789 Other chest pain: Secondary | ICD-10-CM

## 2013-01-12 DIAGNOSIS — R059 Cough, unspecified: Secondary | ICD-10-CM | POA: Insufficient documentation

## 2013-01-12 DIAGNOSIS — R05 Cough: Secondary | ICD-10-CM

## 2013-01-12 LAB — CBC WITH DIFFERENTIAL/PLATELET
Basophils Relative: 1.3 % (ref 0.0–3.0)
Eosinophils Absolute: 0.9 10*3/uL — ABNORMAL HIGH (ref 0.0–0.7)
Eosinophils Relative: 11.6 % — ABNORMAL HIGH (ref 0.0–5.0)
Lymphocytes Relative: 22.7 % (ref 12.0–46.0)
Neutrophils Relative %: 51.7 % (ref 43.0–77.0)
RBC: 3.78 Mil/uL — ABNORMAL LOW (ref 4.22–5.81)
WBC: 7.7 10*3/uL (ref 4.5–10.5)

## 2013-01-12 LAB — D-DIMER, QUANTITATIVE: D-Dimer, Quant: 0.58 ug/mL-FEU — ABNORMAL HIGH (ref 0.00–0.48)

## 2013-01-12 LAB — BASIC METABOLIC PANEL
Calcium: 9.3 mg/dL (ref 8.4–10.5)
Creatinine, Ser: 1.4 mg/dL (ref 0.4–1.5)

## 2013-01-12 LAB — BRAIN NATRIURETIC PEPTIDE: Pro B Natriuretic peptide (BNP): 117 pg/mL — ABNORMAL HIGH (ref 0.0–100.0)

## 2013-01-12 MED ORDER — DOXYCYCLINE HYCLATE 100 MG PO TABS: 100.0000 mg | ORAL_TABLET | Freq: Two times a day (BID) | ORAL | Status: DC

## 2013-01-12 NOTE — Assessment & Plan Note (Signed)
67 year old white male with dry hacking cough x2 weeks. His lung exam is clear. He is mildly hypoxic. Obtain chest x-ray. Also check d-dimer. Treat with doxycycline 100 mg twice daily.

## 2013-01-12 NOTE — Assessment & Plan Note (Signed)
Patient reports episode of severe chest pressure 2 weeks ago. Unclear whether symptoms related to possible bronchitis vs possibility of unstable angina. Reviewed patient with Dr. Swaziland who is covering for Dr. Patty Sermons. Arrange cardiology followup.

## 2013-01-12 NOTE — Patient Instructions (Signed)
Proceed to the emergency room if your chest symptoms return Dr. Elvis Coil office will contact you re: cardiology follow up

## 2013-01-12 NOTE — Telephone Encounter (Signed)
Left message to call back. Per Dr Artist Pais (he spoke with Dr Swaziland), patient needs ov next week.

## 2013-01-12 NOTE — Telephone Encounter (Signed)
Patient aware.

## 2013-01-12 NOTE — Progress Notes (Signed)
Subjective:    Patient ID: Austin Keller, male    DOB: 02/02/1946, 67 y.o.   MRN: [id number]  HPI  67 year old white male with history of nonobstructive coronary disease, hypertension hyperlipidemia complains of dry hacking cough for 2 weeks. His symptoms started 2 weeks ago. His symptoms initially started with a pressure sensation in the center of his chest. Patient felt like he was having severe asthma attack. Patient reports he almost went to the ER. His chest discomfort got better over the next several days however his cough has not improved. He has nonproductive cough. He has mild greenish phlegm in the morning. He denies fever or chills. He has mild shortness of breath. He denies any lower extremity swelling.  Review of Systems Negative for fever,  Cough is nonproductive     Past Medical History  Diagnosis Date  . Hyperlipidemia   . Dupuytren's contracture of hand 08/2011    left  . Speech disorder   . Hypertension     under control; has been on med. x 2-3 yrs.  Marland Kitchen CAD (coronary artery disease) 10/02/11    Moderate LAD disease with calcification. No obstructive CAD. LV function was normal.   . Asthma     as a child    History   Social History  . Marital Status: Married    Spouse Name: N/A    Number of Children: 3  . 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 Smoking status: Former Smoker    Types: Cigarettes    Quit date: 12/06/1997  . Smokeless tobacco: Never Used     Comment: quit smoking 12-15 yrs. ago  . Alcohol Use: 7.0 oz/week    14 drink(s) per week     Comment: 2 drinks/day  . Drug Use: No  . Sexually Active: Not on file   Other Topics Concern  . Not on file   Social History Narrative   Wife is 7 yrs younger- works in [employer]    Past Surgical History  Procedure Laterality Date  . Tonsillectomy      AS CHILD  . Hand surgery      left  . Colonoscopy    . Cardiac  catheterization  10/02/2011    Moderate LAD disease with calcification. No obstructive CAD. Normal LV function.  . Dupuytren contracture release  11/10/2011    Procedure: DUPUYTREN CONTRACTURE RELEASE;  Surgeon: Wyn Forster., MD;  Location: [facility];  Service: Orthopedics;  Laterality: Left;  palm, long, ring, small, thumb and index web left hand     Family History  Problem Relation Age of Onset  . Hip fracture Mother   . Ulcers Father     gastric    No Known Allergies  Current Outpatient Prescriptions on File Prior to Visit  Medication Sig Dispense Refill  . amLODipine (NORVASC) 5 MG tablet Take 1 tablet (5 mg total) by mouth daily.  90 tablet  3  . aspirin 81 MG tablet Take 81 mg by mouth daily.      Marland Kitchen atorvastatin (LIPITOR) 40 MG tablet Take 1 tablet (40 mg total) by mouth daily.  90 tablet  3  . cholecalciferol (VITAMIN D) 400 UNITS TABS Take by mouth.      . Cyanocobalamin (VITAMIN B 12 PO) Take by mouth daily.      . fish oil-omega-3 fatty acids  1000 MG capsule Take 1 g by mouth daily.       Marland Kitchen olmesartan (BENICAR) 20 MG tablet Take 1 tablet (20 mg total) by mouth daily.  90 tablet  3  . vitamin C (ASCORBIC ACID) 500 MG tablet Take 500 mg by mouth daily.       No current facility-administered medications on file prior to visit.    BP 122/80  Pulse 74  Temp(Src) 98.4 F (36.9 C) (Oral)  Wt 258 lb (117.028 kg)  BMI 32.25 kg/m2  SpO2 96%  EKG shows is normal sinus rhythm at 78 beats her minute. Frequent PVCs, ventricular bigeminy.  Objective:   Physical Exam  Constitutional: He is oriented to person, place, and time. He appears well-developed and well-nourished. No distress.  HENT:  Head: Normocephalic and atraumatic.  Right Ear: External ear normal.  Left Ear: External ear normal.  Mild oropharyngeal erythema with signs of postnasal drip  Neck: Neck supple.  Cardiovascular: Normal rate, regular rhythm and normal heart sounds.    Pulmonary/Chest: Effort normal and breath sounds normal. He has no wheezes.  Musculoskeletal: He exhibits no edema.  Negative for calf tenderness  Lymphadenopathy:    He has no cervical adenopathy.  Neurological: He is alert and oriented to person, place, and time. No cranial nerve deficit.  Psychiatric: He has a normal mood and affect. His behavior is normal.          Assessment & Plan:

## 2013-01-12 NOTE — Telephone Encounter (Signed)
Scheduled appointment

## 2013-01-13 ENCOUNTER — Other Ambulatory Visit: Payer: Self-pay | Admitting: Internal Medicine

## 2013-01-13 ENCOUNTER — Encounter (HOSPITAL_COMMUNITY)
Admission: RE | Admit: 2013-01-13 | Discharge: 2013-01-13 | Disposition: A | Discharge status: Home / Self Care | Payer: 59 | Source: Ambulatory Visit | Attending: Internal Medicine | Admitting: Internal Medicine

## 2013-01-13 DIAGNOSIS — R0902 Hypoxemia: Secondary | ICD-10-CM

## 2013-01-13 DIAGNOSIS — R079 Chest pain, unspecified: Secondary | ICD-10-CM | POA: Insufficient documentation

## 2013-01-13 DIAGNOSIS — R7989 Other specified abnormal findings of blood chemistry: Secondary | ICD-10-CM

## 2013-01-13 MED ORDER — TECHNETIUM TC 99M DIETHYLENETRIAME-PENTAACETIC ACID: 40.0000 | Freq: Once | INTRAVENOUS | Status: AC | PRN

## 2013-01-13 MED ORDER — TECHNETIUM TO 99M ALBUMIN AGGREGATED
6.0000 | Freq: Once | INTRAVENOUS | Status: AC | PRN
  Administered 2013-01-13: 6 via INTRAVENOUS

## 2013-01-17 ENCOUNTER — Encounter: Payer: Self-pay | Admitting: Nurse Practitioner

## 2013-01-17 ENCOUNTER — Ambulatory Visit (INDEPENDENT_AMBULATORY_CARE_PROVIDER_SITE_OTHER): Payer: 59 | Admitting: Nurse Practitioner

## 2013-01-17 VITALS — BP 130/74 | HR 64 | Ht 75.0 in | Wt 256.0 lb

## 2013-01-17 DIAGNOSIS — I2510 Atherosclerotic heart disease of native coronary artery without angina pectoris: Secondary | ICD-10-CM

## 2013-01-17 MED ORDER — NITROGLYCERIN 0.4 MG SL SUBL: 0.4000 mg | SUBLINGUAL_TABLET | SUBLINGUAL | Status: DC | PRN

## 2013-01-17 NOTE — Progress Notes (Signed)
Pauletta Browns Date of Birth: 08/08/1946 Medical Record #[id number]  History of Present Illness: Mr. Mau is seen back today for a work in visit. Seen for Dr. Patty Sermons. He has known CAD with moderate LAD disease in 2013 following an abnormal stress test. Other issues include PVCs, HTN, HLD, obesity, and B12 deficiency.   Last seen here in June of 2013. Was doing well.  Comes back today. He is here alone. He is here at the request of Dr. Artist Pais. Earlier this month he had the onset of chest pressure and shortness of breath. It lasted over the course of a day or so. Not exertional related. Gradually went away. Shortly thereafter, developed a cough - treated with Mucinex for about a week - but did not clear - then went to see Dr. Artist Pais last week. D dimer slightly elevated. VQ scan with low probability. EKG showed his PVC's which are chronic. Given doxycycline. Feels great now and has not had recurrent chest discomfort or shortness of breath. Not an avid exerciser. Plays golf. Works in his yard and has no issues. BP has been running low and he is on less Benicar.   Current Outpatient Prescriptions on File Prior to Visit  Medication Sig Dispense Refill  . amLODipine (NORVASC) 5 MG tablet Take 1 tablet (5 mg total) by mouth daily.  90 tablet  3  . aspirin 81 MG tablet Take 81 mg by mouth daily.      Marland Kitchen atorvastatin (LIPITOR) 40 MG tablet Take 1 tablet (40 mg total) by mouth daily.  90 tablet  3  . cholecalciferol (VITAMIN D) 400 UNITS TABS Take by mouth.      . Cyanocobalamin (VITAMIN B 12 PO) Take by mouth daily.      Marland Kitchen doxycycline (VIBRA-TABS) 100 MG tablet Take 1 tablet (100 mg total) by mouth 2 (two) times daily.  20 tablet  0  . vitamin C (ASCORBIC ACID) 500 MG tablet Take 500 mg by mouth daily.       No current facility-administered medications on file prior to visit.    No Known Allergies  Past Medical History  Diagnosis Date  . Hyperlipidemia   . Dupuytren's contracture of hand 08/2011   left  . Speech disorder   . Hypertension     under control; has been on med. x 2-3 yrs.  Marland Kitchen CAD (coronary artery disease) 10/02/11    Moderate LAD disease with calcification. No obstructive CAD. LV function was normal.   . Asthma     as a child    Past Surgical History  Procedure Laterality Date  . Tonsillectomy      AS CHILD  . Hand surgery      left  . Colonoscopy    . Cardiac catheterization  10/02/2011    Moderate LAD disease with calcification. No obstructive CAD. Normal LV function.  . Dupuytren contracture release  11/10/2011    Procedure: DUPUYTREN CONTRACTURE RELEASE;  Surgeon: Wyn Forster., MD;  Location: [facility];  Service: Orthopedics;  Laterality: Left;  palm, long, ring, small, thumb and index web left hand     History  Smoking status  . Former Smoker  . Types: Cigarettes  . Quit date: 12/06/1997  Smokeless tobacco  . Never Used    Comment: quit smoking 12-15 yrs. ago    History  Alcohol Use  . 7.0 oz/week  . 14 drink(s) per week    Comment: 2 drinks/day    Family  History  Problem Relation Age of Onset  . Hip fracture Mother   . Ulcers Father     gastric    Review of Systems: The review of systems is per the HPI.  All other systems were reviewed and are negative.  Physical Exam: BP 130/74  Pulse 64  Ht 6\' 3"  (1.905 m)  Wt 256 lb (116.121 kg)  BMI 32 kg/m2 Patient is very pleasant and in no acute distress. Skin is warm and dry. Color is normal.  HEENT is unremarkable. Normocephalic/atraumatic. PERRL. Sclera are nonicteric. Neck is supple. No masses. No JVD. Lungs are clear. Cardiac exam shows a regular rate and rhythm. Occasional ectopic noted. Abdomen is soft. Extremities are without edema. Gait and ROM are intact. No gross neurologic deficits noted.  LABORATORY DATA: Lab Results  Component Value Date   WBC 7.7 01/12/2013   HGB 12.8* 01/12/2013   HCT 38.3* 01/12/2013   PLT 247.0 01/12/2013   GLUCOSE 102* 01/12/2013   CHOL  147 05/25/2012   TRIG 89.0 05/25/2012   HDL 72.60 05/25/2012   LDLDIRECT 100.7 08/21/2011   LDLCALC 57 05/25/2012   ALT 41 05/25/2012   AST 32 05/25/2012   NA 137 01/12/2013   K 4.3 01/12/2013   CL 104 01/12/2013   CREATININE 1.4 01/12/2013   BUN 22 01/12/2013   CO2 27 01/12/2013   TSH 1.97 08/21/2011   PSA 0.52 08/21/2011   INR 1.1* 09/30/2011   HGBA1C 5.3 10/06/2007   Dg Chest 2 View  01/12/2013   IMPRESSION: No active lung disease.  Question bronchitis.   Original Report Authenticated By: Dwyane Dee, M.D.   Nm Pulmonary Perf And Vent  01/13/2013   IMPRESSION:  Normal ventilation and perfusion lung scans.  Very low probability of pulmonary embolus.   Original Report Authenticated By: Bretta Bang, M.D.    Assessment / Plan: 1. Chest pressure/shortness of breath - followed by bronchitis - has known CAD with moderate LAD disease per cath last year. I do not think repeat stress test is warranted - his last study was abnormal. He has no current symptoms. Would let him have NTG on hand to use prn. If symptoms recur, would favor repeat cardiac cath. I have discussed his case with Dr. Patty Sermons who is in agreement with this plan today. See Dr. Patty Sermons back in 3 months.   2. HTN - blood pressure looks ok  3. PVCs - chronic  Patient is agreeable to this plan and will call if any problems develop in the interim.   Rosalio Macadamia, RN, ANP-C [facility] [address] [place], [place]  [address]

## 2013-01-17 NOTE — Patient Instructions (Signed)
See Dr. Patty Sermons in 3 months  If you have any more chest pain/discomfort/pressure - let our office know  I have sent in a prescription for NTG to your drug store. Use your NTG under your tongue for recurrent chest pain. May take one tablet every 5 minutes. If you are still having discomfort after 3 tablets in 15 minutes, call 911.  Call the [facility] office at [phone] if you have any questions, problems or concerns.

## 2013-01-26 ENCOUNTER — Other Ambulatory Visit (INDEPENDENT_AMBULATORY_CARE_PROVIDER_SITE_OTHER): Payer: 59

## 2013-01-26 DIAGNOSIS — I10 Essential (primary) hypertension: Secondary | ICD-10-CM

## 2013-01-26 LAB — BASIC METABOLIC PANEL
CO2: 27 mEq/L (ref 19–32)
Calcium: 9.3 mg/dL (ref 8.4–10.5)
Sodium: 141 mEq/L (ref 135–145)

## 2013-03-23 ENCOUNTER — Other Ambulatory Visit (INDEPENDENT_AMBULATORY_CARE_PROVIDER_SITE_OTHER): Payer: 59

## 2013-03-23 DIAGNOSIS — E785 Hyperlipidemia, unspecified: Secondary | ICD-10-CM

## 2013-03-23 DIAGNOSIS — R7309 Other abnormal glucose: Secondary | ICD-10-CM

## 2013-03-23 DIAGNOSIS — I10 Essential (primary) hypertension: Secondary | ICD-10-CM

## 2013-03-23 DIAGNOSIS — Z125 Encounter for screening for malignant neoplasm of prostate: Secondary | ICD-10-CM

## 2013-03-23 LAB — HEPATIC FUNCTION PANEL
ALT: 33 U/L (ref 0–53)
Bilirubin, Direct: 0.1 mg/dL (ref 0.0–0.3)
Total Bilirubin: 0.7 mg/dL (ref 0.3–1.2)

## 2013-03-23 LAB — LIPID PANEL
HDL: 66.3 mg/dL (ref >39.00)
Total CHOL/HDL Ratio: 2
Triglycerides: 88 mg/dL (ref 0.0–149.0)

## 2013-03-23 LAB — BASIC METABOLIC PANEL
BUN: 12 mg/dL (ref 6–23)
CO2: 28 mEq/L (ref 19–32)
Calcium: 9.5 mg/dL (ref 8.4–10.5)
Creatinine, Ser: 1 mg/dL (ref 0.4–1.5)
Glucose, Bld: 88 mg/dL (ref 70–99)

## 2013-03-23 LAB — HEMOGLOBIN A1C: Hgb A1c MFr Bld: 5.4 % (ref 4.6–6.5)

## 2013-03-23 LAB — TSH: TSH: 2.08 u[IU]/mL (ref 0.35–5.50)

## 2013-03-29 ENCOUNTER — Ambulatory Visit: Payer: 59 | Admitting: Cardiology

## 2013-03-30 ENCOUNTER — Encounter: Payer: Self-pay | Admitting: Internal Medicine

## 2013-03-30 ENCOUNTER — Ambulatory Visit (INDEPENDENT_AMBULATORY_CARE_PROVIDER_SITE_OTHER): Payer: 59 | Admitting: Internal Medicine

## 2013-03-30 VITALS — BP 150/80 | HR 80 | Temp 98.7°F | Ht 75.0 in | Wt 256.0 lb

## 2013-03-30 DIAGNOSIS — Z0000 Encounter for general adult medical examination without abnormal findings: Secondary | ICD-10-CM

## 2013-03-30 DIAGNOSIS — I10 Essential (primary) hypertension: Secondary | ICD-10-CM

## 2013-03-30 MED ORDER — ATORVASTATIN CALCIUM 40 MG PO TABS: 40.0000 mg | ORAL_TABLET | Freq: Every day | ORAL | Status: DC

## 2013-03-30 MED ORDER — AMLODIPINE BESYLATE 5 MG PO TABS: 5.0000 mg | ORAL_TABLET | Freq: Every day | ORAL | Status: DC

## 2013-03-30 MED ORDER — OLMESARTAN MEDOXOMIL 20 MG PO TABS: 20.0000 mg | ORAL_TABLET | Freq: Every day | ORAL | Status: DC

## 2013-03-30 NOTE — Assessment & Plan Note (Signed)
Reviewed adult health maintenance protocols.  Patient's is a rectal exam and PSA are normal. We discussed low yield of further prostate cancer screening.  Consider Tdap at next OV.

## 2013-03-30 NOTE — Progress Notes (Signed)
Subjective:    Patient ID: Austin Keller, male    DOB: October 23, 1945, 67 y.o.   MRN: [id number]  HPI  67 year old white male with history of hypertension, hyperlipidemia and nonobstructive coronary artery disease for routine physical. Patient denies any significant interval medical history. Patient's previous episode of bronchitis completely resolved. Patient also seen by cardiology regarding chest pain. Chest pain has resolved. No further cardiac testing planned.  Patient not having any prostate symptoms.  Patient has been monitoring blood pressure at home. They range from 120 to 141 systolic. He denies any dizziness.  Patient thinks employer will likely offer "early retirement"  Review of Systems   Constitutional: Negative for activity change, appetite change and unexpected weight change.  Eyes: Negative for visual disturbance.  Respiratory: Negative for cough, chest tightness and shortness of breath.   Cardiovascular: Negative for chest pain.  Genitourinary: Negative for difficulty urinating.  Neurological: Negative for headaches.  Gastrointestinal: Negative for abdominal pain, heartburn melena or hematochezia Psych: Negative for depression or anxiety        Past Medical History  Diagnosis Date  . Hyperlipidemia   . Dupuytren's contracture of hand 08/2011    left  . Speech disorder   . Hypertension     under control; has been on med. x 2-3 yrs.  Marland Kitchen CAD (coronary artery disease) 10/02/11    Moderate LAD disease with calcification. No obstructive CAD. LV function was normal.   . Asthma     as a child    History   Social History  . Marital Status: Married    Spouse Name: N/A    Number of Children: 3  . 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 Smoking status: Former Smoker    Types: Cigarettes    Quit date: 12/06/1997  . Smokeless tobacco: Never Used     Comment: quit smoking 12-15 yrs.  ago  . Alcohol Use: 7.0 oz/week    14 drink(s) per week     Comment: 2 drinks/day  . Drug Use: No  . Sexually Active: Not on file   Other Topics Concern  . Not on file   Social History Narrative   Wife is 7 yrs younger- works in [employer]    Past Surgical History  Procedure Laterality Date  . Tonsillectomy      AS CHILD  . Hand surgery      left  . Colonoscopy    . Cardiac catheterization  10/02/2011    Moderate LAD disease with calcification. No obstructive CAD. Normal LV function.  . Dupuytren contracture release  11/10/2011    Procedure: DUPUYTREN CONTRACTURE RELEASE;  Surgeon: Wyn Forster., MD;  Location: [facility];  Service: Orthopedics;  Laterality: Left;  palm, long, ring, small, thumb and index web left hand     Family History  Problem Relation Age of Onset  . Hip fracture Mother   . Ulcers Father     gastric    No Known Allergies  Current Outpatient Prescriptions on File Prior to Visit  Medication Sig Dispense Refill  . aspirin 81 MG tablet Take 81 mg by mouth daily.      . cholecalciferol (VITAMIN D) 400 UNITS TABS Take by mouth.      . Cyanocobalamin (VITAMIN B 12 PO) Take by mouth daily.      . nitroGLYCERIN (NITROSTAT) 0.4 MG  SL tablet Place 1 tablet (0.4 mg total) under the tongue every 5 (five) minutes as needed for chest pain.  25 tablet  3   No current facility-administered medications on file prior to visit.    BP 150/80  Pulse 80  Temp(Src) 98.7 F (37.1 C) (Oral)  Ht 6\' 3"  (1.905 m)  Wt 256 lb (116.121 kg)  BMI 32 kg/m2    Objective:   Physical Exam  Constitutional: He is oriented to person, place, and time. He appears well-developed and well-nourished.  HENT:  Head: Normocephalic and atraumatic.  Right Ear: External ear normal.  Left Ear: External ear normal.  Mouth/Throat: Oropharynx is clear and moist.  Eyes: Conjunctivae and EOM are normal. Pupils are equal, round, and reactive to light.  Neck: Neck supple.  No  carotid bruit  Cardiovascular: Normal rate, regular rhythm and normal heart sounds.   No murmur heard. Pulmonary/Chest: Effort normal and breath sounds normal. He has no wheezes.  Abdominal: Soft. Bowel sounds are normal. There is no tenderness.  Genitourinary: Rectum normal.  Limited prostate exam, unable to completely palpate prostate. However unable to appreciate prostate nodules or asymmetry.  Musculoskeletal: He exhibits no edema.  Lymphadenopathy:    He has no cervical adenopathy.  Neurological: He is alert and oriented to person, place, and time. No cranial nerve deficit.  Skin: Skin is warm and dry.  Psychiatric: He has a normal mood and affect. His behavior is normal.          Assessment & Plan:

## 2013-03-30 NOTE — Patient Instructions (Addendum)
Please complete the following lab tests before your next follow up appointment: BMET - 401.9 

## 2013-03-30 NOTE — Assessment & Plan Note (Signed)
Blood pressure is suboptimally controlled. Increase Benicar to 20 mg once daily. Continue amlodipine 5 mg. Patient advised to monitor blood pressure at home. Arrange followup electrolytes and kidney function. BP: 150/80 mmHg

## 2013-04-10 ENCOUNTER — Ambulatory Visit (INDEPENDENT_AMBULATORY_CARE_PROVIDER_SITE_OTHER): Payer: 59 | Admitting: Cardiology

## 2013-04-10 ENCOUNTER — Encounter: Payer: Self-pay | Admitting: Cardiology

## 2013-04-10 VITALS — BP 126/72 | HR 68 | Ht 75.0 in | Wt 257.0 lb

## 2013-04-10 DIAGNOSIS — I259 Chronic ischemic heart disease, unspecified: Secondary | ICD-10-CM

## 2013-04-10 DIAGNOSIS — I10 Essential (primary) hypertension: Secondary | ICD-10-CM

## 2013-04-10 DIAGNOSIS — E785 Hyperlipidemia, unspecified: Secondary | ICD-10-CM

## 2013-04-10 DIAGNOSIS — I2510 Atherosclerotic heart disease of native coronary artery without angina pectoris: Secondary | ICD-10-CM

## 2013-04-10 NOTE — Progress Notes (Signed)
Austin Keller Date of Birth:  21-Sep-1945 [facility] [address] [place], [place]  [address] [phone]  Fax   [phone]  HPI: This pleasant 67 year old gentleman is seen for a scheduled followup office visit. We saw him initially in January 2013 for preoperative cardiac  clearance for surgery on his left knee. At the time of his preoperative EKG had a lot of PVCs. He was noted to have multiple risk factors for premature coronary disease. He had an abnormal stress test which led to a cardiac catheterization. His catheterization showed moderate LAD disease without obstruction and he had normal LV function. He  went ahead and had successful surgery by Dr. Teressa Senter. .  From the cardiac standpoint has been doing well with no chest pain or shortness of breath.  He enjoys playing golf and walks about half the current course and rides a cart the other half.  He had a recent complete physical with Dr. Artist Pais and reports that all of his numbers were satisfactory.  Current Outpatient Prescriptions  Medication Sig Dispense Refill  . amLODipine (NORVASC) 5 MG tablet Take 1 tablet (5 mg total) by mouth daily.  90 tablet  3  . aspirin 81 MG tablet Take 81 mg by mouth daily.      Marland Kitchen atorvastatin (LIPITOR) 40 MG tablet Take 1 tablet (40 mg total) by mouth daily.  90 tablet  3  . Cyanocobalamin (VITAMIN B 12 PO) Take by mouth daily.      . nitroGLYCERIN (NITROSTAT) 0.4 MG SL tablet Place 1 tablet (0.4 mg total) under the tongue every 5 (five) minutes as needed for chest pain.  25 tablet  3  . olmesartan (BENICAR) 20 MG tablet Take 1 tablet (20 mg total) by mouth daily.  90 tablet  3   No current facility-administered medications for this visit.    No Known Allergies  Patient Active Problem List   Diagnosis Date Noted  . Unspecified vitamin B deficiency 02/29/2012  . CAD (coronary artery disease) 10/16/2011  . B12 deficiency 08/28/2011  . Preventative health care 08/28/2011  .  FOOT PAIN 06/03/2010  . PARONYCHIA, FINGER 10/10/2009  . DUPUYTREN'S CONTRACTURE, LEFT 02/07/2009  . OBESITY 10/09/2008  . 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 ALLERGIC RHINITIS 08/15/2008  . CONTACT DERMATITIS 12/09/2007  . HYPERLIPIDEMIA 10/12/2007  . HYPERTENSION 10/12/2007  . OTHER ABNORMAL GLUCOSE 10/06/2007    History  Smoking status  . Former Smoker  . Types: Cigarettes  . Quit date: 12/06/1997  Smokeless tobacco  . Never Used    Comment: quit smoking 12-15 yrs. ago    History  Alcohol Use  . 7.0 oz/week  . 14 drink(s) per week    Comment: 2 drinks/day    Family History  Problem Relation Age of Onset  . Hip fracture Mother   . Ulcers Father     gastric    Review of Systems: The patient denies any heat or cold intolerance.  No weight gain or weight loss.  The patient denies headaches or blurry vision.  There is no cough or sputum production.  The patient denies dizziness.  There is no hematuria or hematochezia.  The patient denies any muscle aches or arthritis.  The patient denies any rash.  The patient denies frequent falling or instability.  There is no history of depression or anxiety.  All other systems were reviewed and are negative.   Physical Exam: Filed Vitals:   04/10/13 1511  BP: 126/72  Pulse: 68  the general appearance reveals a well-developed well-nourished middle-aged gentleman in no distress.The head and neck exam reveals pupils equal and reactive.  Extraocular movements are full.  There is no scleral icterus.  The mouth and pharynx are normal.  The neck is supple.  The carotids reveal no bruits.  The jugular venous pressure is normal.  The  thyroid is not enlarged.  There is no lymphadenopathy.  The chest is clear to percussion and auscultation.  There are no rales or rhonchi.  Expansion of the chest is symmetrical.  The precordium is quiet.  The first heart sound is normal.  The second heart sound is physiologically split.  There is no murmur gallop rub or  click.  There is no abnormal lift or heave.  The abdomen is soft and nontender.  The bowel sounds are normal.  The liver and spleen are not enlarged.  There are no abdominal masses.  There are no abdominal bruits.  Extremities reveal good pedal pulses.  There is no phlebitis or edema.  There is no cyanosis or clubbing.  Strength is normal and symmetrical in all extremities.  There is no lateralizing weakness.  There are no sensory deficits.  The skin is warm and dry.  There is no rash.      Assessment / Plan: Continue same medication.  He is doing very well.  Recheck in 6 months for office visit and EKG

## 2013-04-10 NOTE — Patient Instructions (Addendum)
Your physician recommends that you continue on your current medications as directed. Please refer to the Current Medication list given to you today.  Your physician wants you to follow-up in: 6 month ov/ekg You will receive a reminder letter in the mail two months in advance. If you don't receive a letter, please call our office to schedule the follow-up appointment.  

## 2013-04-10 NOTE — Assessment & Plan Note (Signed)
The patient has not had any recurrent chest pain or angina pectoris. 

## 2013-04-10 NOTE — Assessment & Plan Note (Signed)
Blood pressure is remaining stable.  He previously had been on 10 mg Benicar but more recently his dose was increased back to 20 mg daily.

## 2013-04-10 NOTE — Assessment & Plan Note (Signed)
The patient is not having any myalgias from his statin therapy. 

## 2013-05-24 ENCOUNTER — Other Ambulatory Visit: Payer: 59

## 2013-05-24 ENCOUNTER — Telehealth: Payer: Self-pay | Admitting: Internal Medicine

## 2013-05-24 ENCOUNTER — Ambulatory Visit (INDEPENDENT_AMBULATORY_CARE_PROVIDER_SITE_OTHER): Payer: 59 | Admitting: *Deleted

## 2013-05-24 DIAGNOSIS — Z23 Encounter for immunization: Secondary | ICD-10-CM

## 2013-05-24 NOTE — Telephone Encounter (Signed)
Pt leaving for a cruise on fri and would like flu shot today. Can you give that to him today at a specific time?

## 2013-05-24 NOTE — Telephone Encounter (Signed)
See if pt can come in now

## 2013-05-24 NOTE — Telephone Encounter (Signed)
Pt is on the way. Thank you!

## 2013-05-25 ENCOUNTER — Encounter: Payer: 59 | Admitting: Internal Medicine

## 2013-05-31 ENCOUNTER — Encounter: Payer: 59 | Admitting: Internal Medicine

## 2013-06-08 ENCOUNTER — Other Ambulatory Visit: Payer: 59

## 2013-06-14 ENCOUNTER — Telehealth: Payer: Self-pay | Admitting: Internal Medicine

## 2013-06-14 ENCOUNTER — Other Ambulatory Visit: Payer: Self-pay | Admitting: *Deleted

## 2013-06-14 ENCOUNTER — Telehealth: Payer: Self-pay | Admitting: *Deleted

## 2013-06-14 NOTE — Telephone Encounter (Signed)
Pt requesting rx to be changed from branded Benicar to a generic.

## 2013-06-14 NOTE — Telephone Encounter (Signed)
Not generic-pt informed

## 2013-06-14 NOTE — Telephone Encounter (Signed)
error 

## 2013-06-15 ENCOUNTER — Ambulatory Visit: Payer: 59 | Admitting: Internal Medicine

## 2013-06-29 ENCOUNTER — Other Ambulatory Visit (INDEPENDENT_AMBULATORY_CARE_PROVIDER_SITE_OTHER): Payer: Medicare Other

## 2013-06-29 DIAGNOSIS — I10 Essential (primary) hypertension: Secondary | ICD-10-CM

## 2013-06-29 LAB — BASIC METABOLIC PANEL
CO2: 30 mEq/L (ref 19–32)
Chloride: 104 mEq/L (ref 96–112)
Potassium: 4.4 mEq/L (ref 3.5–5.1)
Sodium: 140 mEq/L (ref 135–145)

## 2013-07-07 ENCOUNTER — Encounter: Payer: Self-pay | Admitting: Internal Medicine

## 2013-07-07 ENCOUNTER — Ambulatory Visit (INDEPENDENT_AMBULATORY_CARE_PROVIDER_SITE_OTHER): Payer: Medicare Other | Admitting: Internal Medicine

## 2013-07-07 VITALS — BP 140/80 | HR 80 | Temp 98.2°F | Ht 75.0 in | Wt 255.0 lb

## 2013-07-07 DIAGNOSIS — I10 Essential (primary) hypertension: Secondary | ICD-10-CM

## 2013-07-07 DIAGNOSIS — E785 Hyperlipidemia, unspecified: Secondary | ICD-10-CM

## 2013-07-07 MED ORDER — IRBESARTAN 150 MG PO TABS: 150.0000 mg | ORAL_TABLET | Freq: Every day | ORAL | Status: DC

## 2013-07-07 NOTE — Progress Notes (Signed)
Subjective:    Patient ID: Austin Keller, male    DOB: July 10, 1946, 67 y.o.   MRN: [id number]  HPI  67 year old white male with hx of non obstructive CAD, Htn and hyperlipidemia for routine follow up.  No significant interval medical hx.  Good medication compliance.  He is enjoying retirement.    Review of Systems Mild weight loss Past Medical History  Diagnosis Date  . Hyperlipidemia   . Dupuytren's contracture of hand 08/2011    left  . Speech disorder   . Hypertension     under control; has been on med. x 2-3 yrs.  Marland Kitchen CAD (coronary artery disease) 10/02/11    Moderate LAD disease with calcification. No obstructive CAD. LV function was normal.   . Asthma     as a child    History   Social History  . Marital Status: Married    Spouse Name: N/A    Number of Children: 3  . 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 Smoking status: Former Smoker    Types: Cigarettes    Quit date: 12/06/1997  . Smokeless tobacco: Never Used     Comment: quit smoking 12-15 yrs. ago  . Alcohol Use: 7.0 oz/week    14 drink(s) per week     Comment: 2 drinks/day  . Drug Use: No  . Sexual Activity: Not on file   Other Topics Concern  . Not on file   Social History Narrative   Wife is 7 yrs younger- works in [employer]    Past Surgical History  Procedure Laterality Date  . Tonsillectomy      AS CHILD  . Hand surgery      left  . Colonoscopy    . Cardiac catheterization  10/02/2011    Moderate LAD disease with calcification. No obstructive CAD. Normal LV function.  . Dupuytren contracture release  11/10/2011    Procedure: DUPUYTREN CONTRACTURE RELEASE;  Surgeon: Wyn Forster., MD;  Location: [facility];  Service: Orthopedics;  Laterality: Left;  palm, long, ring, small, thumb and index web left hand     Family History  Problem Relation Age of Onset  . Hip fracture Mother   . Ulcers Father    gastric    No Known Allergies  Current Outpatient Prescriptions on File Prior to Visit  Medication Sig Dispense Refill  . amLODipine (NORVASC) 5 MG tablet Take 1 tablet (5 mg total) by mouth daily.  90 tablet  3  . aspirin 81 MG tablet Take 81 mg by mouth daily.      Marland Kitchen atorvastatin (LIPITOR) 40 MG tablet Take 1 tablet (40 mg total) by mouth daily.  90 tablet  3  . Cyanocobalamin (VITAMIN B 12 PO) Take by mouth daily.      . nitroGLYCERIN (NITROSTAT) 0.4 MG SL tablet Place 1 tablet (0.4 mg total) under the tongue every 5 (five) minutes as needed for chest pain.  25 tablet  3   No current facility-administered medications on file prior to visit.    BP 140/80  Pulse 80  Temp(Src) 98.2 F (36.8 C) (Oral)  Ht 6\' 3"  (1.905 m)  Wt 255 lb (115.667 kg)  BMI 31.87 kg/m2       Objective:   Physical Exam  Constitutional: He is oriented to person, place, and time. He appears well-developed  and well-nourished.  Neck:  No carotid bruit  Cardiovascular: Normal rate, regular rhythm and normal heart sounds.   No murmur heard. Pulmonary/Chest: Effort normal and breath sounds normal.  Neurological: He is alert and oriented to person, place, and time. No cranial nerve deficit.  Psychiatric: He has a normal mood and affect. His behavior is normal.          Assessment & Plan:

## 2013-07-07 NOTE — Assessment & Plan Note (Addendum)
No change.  Maintain statin therapy.  Lab Results  Component Value Date   CHOL 149 03/23/2013   HDL 66.30 03/23/2013   LDLCALC 65 03/23/2013   LDLDIRECT 100.7 08/21/2011   TRIG 88.0 03/23/2013   CHOLHDL 2 03/23/2013

## 2013-07-07 NOTE — Patient Instructions (Signed)
Get your Pertussis vaccine from local health department Monitor your blood pressure as you change medications Please complete the following lab tests before your next follow up appointment: BMET - 401.9

## 2013-07-07 NOTE — Assessment & Plan Note (Signed)
Stressed importance of BP control.  Continue to monitor BP at home.  If SBP consistently greater than 140 , he will increase amlodipine to 7.5 mg.  BP: 140/80 mmHg  Lab Results  Component Value Date   NA 140 06/29/2013   K 4.4 06/29/2013   CL 104 06/29/2013   CO2 30 06/29/2013   Lab Results  Component Value Date   CREATININE 1.1 06/29/2013

## 2013-09-11 ENCOUNTER — Other Ambulatory Visit: Payer: Self-pay | Admitting: Internal Medicine

## 2013-09-13 ENCOUNTER — Other Ambulatory Visit: Payer: Self-pay | Admitting: Internal Medicine

## 2013-09-25 ENCOUNTER — Telehealth: Payer: Self-pay | Admitting: Internal Medicine

## 2013-09-25 ENCOUNTER — Ambulatory Visit: Payer: Self-pay | Admitting: Family Medicine

## 2013-09-25 NOTE — Telephone Encounter (Signed)
[facility] Triage Call Report Triage Record Num: [id number] Operator: Soledad Gerlach Patient Name: Austin Keller Call Date & Time: 09/24/2013 11:31:20AM Patient Phone: [phone] PCP: Drema Pry Patient Gender: Male PCP Fax : [phone] Patient DOB: 14-Jan-1946 Practice Name: [facility] Reason for Call: Caller: Sam/Patient; PCP: Alyson Ingles Herbie [place]) (Adults only); CB#: [phone]; Call regarding "Congestion/voice change/medication questions, has high bp and cholestrol." Onset of cold symptoms 09/20/13 and was told could use Mucinex for the congestion but states has new onset of cough which keeps him from being able to sleep. States has chills, fatigue. Afebrile. c/o sore throat "due to cough." Per cough protocol, emergent symptoms denied; advised appt within 24 hours due to yellow/green sputum produced with cough. Appt scheduled 1145 09/25/13 with Dr. Elease Hashimoto. Protocol(s) Used: Cough - Adult Recommended Outcome per Protocol: See Provider within 24 hours Reason for Outcome: Productive cough with colored sputum (other than clear or white sputum) Care Advice: ~ Use a cool mist humidifier to moisten air. Be sure to clean according to manufacturer's instructions. Limit or avoid exposure to irritants and allergens (e.g. air pollution, smoke/smoking, chemicals, dust, pollen, pet dander, etc.) ~ Call provider if fever greater than 101.5 F (38.6 C) or 100.5 F (38.1C) in an immunocompromised patient (such as diabetes, HIV/AIDS, renal disease, chemotherapy, organ transplant, or chronic steroid use) has not improved in 24 hours. ~ ~ If you can, stop smoking now and avoid all secondhand smoke. ~ HEALTH PROMOTION / MAINTENANCE Drink more fluids -- water, low-sugar juices, tea and warm soup, especially chicken broth, are options. Avoid caffeinated or alcoholic beverages because they can increase the chance of dehydration. ~ ~ SYMPTOM / CONDITION MANAGEMENT ~ CAUTIONS Coughing  up mucus or phlegm helps to get rid of an infection. A productive cough should not be stopped. A cough medicine with guaifenesin (Robitussin, Mucinex) can help loosen the mucus. Cough medicine with dextromethorphan (DM) should be avoided. Drinking lots of fluids can help loosen the mucus too, especially warm fluids. ~ Total water intake includes drinking water, water in beverages, and water contained in food. Fluids make up about 80% of the body's total hydration need. Individual fluid requirement to maintain hydration vary based on physical activity, environmental factors and illness. Limit fluids that contain sugar, caffeine, or alcohol. Urine will be very light yellow color when you drink enough fluids. ~ 02/

## 2013-09-26 ENCOUNTER — Ambulatory Visit (INDEPENDENT_AMBULATORY_CARE_PROVIDER_SITE_OTHER): Payer: Medicare Other | Admitting: Family Medicine

## 2013-09-26 ENCOUNTER — Encounter: Payer: Self-pay | Admitting: Family Medicine

## 2013-09-26 VITALS — BP 140/66 | HR 92 | Temp 98.7°F | Wt 246.0 lb

## 2013-09-26 DIAGNOSIS — J209 Acute bronchitis, unspecified: Secondary | ICD-10-CM

## 2013-09-26 NOTE — Patient Instructions (Signed)
Acute Bronchitis Bronchitis is inflammation of the airways that extend from the windpipe into the lungs (bronchi). The inflammation often causes mucus to develop. This leads to a cough, which is the most common symptom of bronchitis.  In acute bronchitis, the condition usually develops suddenly and goes away over time, usually in a couple weeks. Smoking, allergies, and asthma can make bronchitis worse. Repeated episodes of bronchitis may cause further lung problems.  CAUSES Acute bronchitis is most often caused by the same virus that causes a cold. The virus can spread from person to person (contagious).  SIGNS AND SYMPTOMS   Cough.   Fever.   Coughing up mucus.   Body aches.   Chest congestion.   Chills.   Shortness of breath.   Sore throat.  DIAGNOSIS  Acute bronchitis is usually diagnosed through a physical exam. Tests, such as chest X-rays, are sometimes done to rule out other conditions.  TREATMENT  Acute bronchitis usually goes away in a couple weeks. Often times, no medical treatment is necessary. Medicines are sometimes given for relief of fever or cough. Antibiotics are usually not needed but may be prescribed in certain situations. In some cases, an inhaler may be recommended to help reduce shortness of breath and control the cough. A cool mist vaporizer may also be used to help thin bronchial secretions and make it easier to clear the chest.  HOME CARE INSTRUCTIONS  Get plenty of rest.   Drink enough fluids to keep your urine clear or pale yellow (unless you have a medical condition that requires fluid restriction). Increasing fluids may help thin your secretions and will prevent dehydration.   Only take over-the-counter or prescription medicines as directed by your health care provider.   Avoid smoking and secondhand smoke. Exposure to cigarette smoke or irritating chemicals will make bronchitis worse. If you are a smoker, consider using nicotine gum or skin  patches to help control withdrawal symptoms. Quitting smoking will help your lungs heal faster.   Reduce the chances of another bout of acute bronchitis by washing your hands frequently, avoiding people with cold symptoms, and trying not to touch your hands to your mouth, nose, or eyes.   Follow up with your health care provider as directed.  SEEK MEDICAL CARE IF: Your symptoms do not improve after 1 week of treatment.  SEEK IMMEDIATE MEDICAL CARE IF:  You develop an increased fever or chills.   You have chest pain.   You have severe shortness of breath.  You have bloody sputum.   You develop dehydration.  You develop fainting.  You develop repeated vomiting.  You develop a severe headache. MAKE SURE YOU:   Understand these instructions.  Will watch your condition.  Will get help right away if you are not doing well or get worse. Document Released: 09/17/2004 Document Revised: 04/12/2013 Document Reviewed: 01/31/2013 ExitCare Patient Information 2014 [employer].  

## 2013-09-26 NOTE — Progress Notes (Signed)
   Subjective:    Patient ID: Austin Keller, male    DOB: 12-23-1945, 68 y.o.   MRN: [id number]  HPI Patient is seen for acute visit Onset last Wednesday of cough which is mostly nonproductive. By Friday his symptoms were worse. Some fatigue. Never had fever. By Saturday had some chills. He felt better by Sunday. He has a nagging dry cough at this time but overall improved compared to last week. He is taking Mucinex and Robitussin-DM and Robitussin DM helped relieve his cough. He is a former smoker. No recent hemoptysis. No dyspnea. No wheezing. Overall feels better this time  Past Medical History  Diagnosis Date  . Hyperlipidemia   . Dupuytren's contracture of hand 08/2011    left  . Speech disorder   . Hypertension     under control; has been on med. x 2-3 yrs.  Marland Kitchen CAD (coronary artery disease) 10/02/11    Moderate LAD disease with calcification. No obstructive CAD. LV function was normal.   . Asthma     as a child   Past Surgical History  Procedure Laterality Date  . Tonsillectomy      AS CHILD  . Hand surgery      left  . Colonoscopy    . Cardiac catheterization  10/02/2011    Moderate LAD disease with calcification. No obstructive CAD. Normal LV function.  . Dupuytren contracture release  11/10/2011    Procedure: DUPUYTREN CONTRACTURE RELEASE;  Surgeon: Cammie Sickle., MD;  Location: [facility];  Service: Orthopedics;  Laterality: Left;  palm, long, ring, small, thumb and index web left hand     reports that he quit smoking about 15 years ago. His smoking use included Cigarettes. He smoked 0.00 packs per day. He has never used smokeless tobacco. He reports that he drinks about 7.0 ounces of alcohol per week. He reports that he does not use illicit drugs. family history includes Hip fracture in his mother; Ulcers in his father. No Known Allergies    Review of Systems  Constitutional: Negative for fever.  HENT: Positive for congestion.   Respiratory:  Positive for cough. Negative for shortness of breath and wheezing.   Cardiovascular: Negative for chest pain.       Objective:   Physical Exam  Constitutional: He appears well-developed and well-nourished.  HENT:  Right Ear: External ear normal.  Left Ear: External ear normal.  Mouth/Throat: Oropharynx is clear and moist.  Neck: Neck supple.  Cardiovascular: Normal rate.   Pulmonary/Chest: Effort normal and breath sounds normal. No respiratory distress. He has no wheezes. He has no rales.  Lymphadenopathy:    He has no cervical adenopathy.          Assessment & Plan:  Acute bronchitis. Suspect viral. Continue over-the-counter medications for symptomatic relief. Followup promptly for any fever or worsening symptoms

## 2013-09-26 NOTE — Progress Notes (Signed)
Pre visit review using our clinic review tool, if applicable. No additional management support is needed unless otherwise documented below in the visit note. 

## 2013-10-11 ENCOUNTER — Ambulatory Visit: Payer: Medicare Other | Admitting: Cardiology

## 2013-10-20 ENCOUNTER — Encounter: Payer: Self-pay | Admitting: Internal Medicine

## 2013-10-20 ENCOUNTER — Other Ambulatory Visit: Payer: Self-pay | Admitting: Internal Medicine

## 2013-10-20 ENCOUNTER — Ambulatory Visit (INDEPENDENT_AMBULATORY_CARE_PROVIDER_SITE_OTHER): Payer: Medicare Other | Admitting: Internal Medicine

## 2013-10-20 ENCOUNTER — Ambulatory Visit: Payer: Medicare Other | Admitting: Internal Medicine

## 2013-10-20 ENCOUNTER — Ambulatory Visit: Payer: Medicare Other | Admitting: Family Medicine

## 2013-10-20 VITALS — BP 117/67 | HR 70 | Temp 97.9°F | Wt 252.0 lb

## 2013-10-20 DIAGNOSIS — E538 Deficiency of other specified B group vitamins: Secondary | ICD-10-CM

## 2013-10-20 DIAGNOSIS — IMO0001 Reserved for inherently not codable concepts without codable children: Secondary | ICD-10-CM

## 2013-10-20 DIAGNOSIS — R29898 Other symptoms and signs involving the musculoskeletal system: Secondary | ICD-10-CM

## 2013-10-20 LAB — CBC WITH DIFFERENTIAL/PLATELET
BASOS ABS: 0.1 10*3/uL (ref 0.0–0.1)
BASOS PCT: 1 % (ref 0.0–3.0)
Eosinophils Absolute: 0.4 10*3/uL (ref 0.0–0.7)
Eosinophils Relative: 3.9 % (ref 0.0–5.0)
HCT: 40.7 % (ref 39.0–52.0)
Hemoglobin: 13.5 g/dL (ref 13.0–17.0)
LYMPHS PCT: 9.6 % — AB (ref 12.0–46.0)
Lymphs Abs: 1 10*3/uL (ref 0.7–4.0)
MCHC: 33.2 g/dL (ref 30.0–36.0)
MCV: 101.2 fl — AB (ref 78.0–100.0)
MONO ABS: 0.7 10*3/uL (ref 0.1–1.0)
Monocytes Relative: 6.5 % (ref 3.0–12.0)
NEUTROS ABS: 8.5 10*3/uL — AB (ref 1.4–7.7)
NEUTROS PCT: 79 % — AB (ref 43.0–77.0)
Platelets: 353 10*3/uL (ref 150.0–400.0)
RBC: 4.02 Mil/uL — AB (ref 4.22–5.81)
RDW: 13.6 % (ref 11.5–14.6)
WBC: 10.8 10*3/uL — ABNORMAL HIGH (ref 4.5–10.5)

## 2013-10-20 LAB — COMPREHENSIVE METABOLIC PANEL
ALK PHOS: 44 U/L (ref 39–117)
ALT: 463 U/L — AB (ref 0–53)
AST: 515 U/L — ABNORMAL HIGH (ref 0–37)
Albumin: 3.1 g/dL — ABNORMAL LOW (ref 3.5–5.2)
BUN: 13 mg/dL (ref 6–23)
CHLORIDE: 98 meq/L (ref 96–112)
CO2: 28 mEq/L (ref 19–32)
CREATININE: 0.7 mg/dL (ref 0.4–1.5)
Calcium: 8.8 mg/dL (ref 8.4–10.5)
GFR: 113.03 mL/min (ref >60.00)
Glucose, Bld: 103 mg/dL — ABNORMAL HIGH (ref 70–99)
POTASSIUM: 3.9 meq/L (ref 3.5–5.1)
Sodium: 133 mEq/L — ABNORMAL LOW (ref 135–145)
Total Bilirubin: 0.8 mg/dL (ref 0.3–1.2)
Total Protein: 6.3 g/dL (ref 6.0–8.3)

## 2013-10-20 LAB — FOLATE: FOLATE: 7.4 ng/mL (ref >5.9)

## 2013-10-20 LAB — VITAMIN B12: VITAMIN B 12: 853 pg/mL (ref 211–911)

## 2013-10-20 NOTE — Progress Notes (Signed)
Pre visit review using our clinic review tool, if applicable. No additional management support is needed unless otherwise documented below in the visit note. 

## 2013-10-20 NOTE — Assessment & Plan Note (Addendum)
6 weeks history of lower extremity weakness after exertion (proximal > distal), differential diagnosis is large but includes myasthenia gravis, paraneoplastic syndrome and many other issues. No obvious myelopathy or radiculopathy. On chart review, recent PSA, TSH were within normal. He did have mild anemia. Plan: Labs Including a W43, folic acid, RPR, chest x-ray, neuro referral, Likely he will need further eval and tests

## 2013-10-20 NOTE — Progress Notes (Signed)
Subjective:    Patient ID: Austin Keller, male    DOB: Jan 11, 1946, 69 y.o.   MRN: [id number]  DOS:  10/20/2013 Reason for visit: "Weakness from the waist down". Symptoms started gradually 4 to  6 weeks ago when he developed lower extremity weakness, for instance after he does some grocery shopping when he goes back to his car his legs are so weak that he needs to pull them w/  his arms inside the car. Symptoms are slightly more noticeable since he had a bronchitis ~3 weeks ago (symptoms of bronchitis resolved) Denies any rash, actual pain or paresthesias. No new medication except Benicar was  switch to Diovan  ~ 06-2013 .  ROS Denies fever or chills. No weight loss or headaches. No bladder or bowel incontinence.No diplopia. Denies any chest pain, cough, sputum production or hematemesis. No nausea, vomiting, diarrhea. No back pain.  Past Medical History  Diagnosis Date  . Hyperlipidemia   . Dupuytren's contracture of hand 08/2011    left  . Speech disorder   . Hypertension     under control; has been on med. x 2-3 yrs.  Marland Kitchen CAD (coronary artery disease) 10/02/11    Moderate LAD disease with calcification. No obstructive CAD. LV function was normal.   . Asthma     as a child    Past Surgical History  Procedure Laterality Date  . Tonsillectomy      AS CHILD  . Hand surgery      left  . Colonoscopy    . Cardiac catheterization  10/02/2011    Moderate LAD disease with calcification. No obstructive CAD. Normal LV function.  . Dupuytren contracture release  11/10/2011    Procedure: DUPUYTREN CONTRACTURE RELEASE;  Surgeon: Cammie Sickle., MD;  Location: [facility];  Service: Orthopedics;  Laterality: Left;  palm, long, ring, small, thumb and index web left hand     History   Social History  . Marital Status: Married    Spouse Name: N/A    Number of Children: 3  . 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 Smoking status: Former Smoker    Types: Cigarettes    Quit date: 12/06/1997  . Smokeless tobacco: Never Used     Comment: quit smoking 12-15 yrs. ago  . Alcohol Use: 7.0 oz/week    14 drink(s) per week     Comment: 2 drinks/day  . Drug Use: No  . Sexual Activity: Not on file   Other Topics Concern  . Not on file   Social History Narrative   Wife is 7 yrs younger- works in [employer]        Medication List       This list is accurate as of: 10/20/13 11:59 PM.  Always use your most recent med list.               amLODipine 5 MG tablet  Commonly known as:  NORVASC  TAKE ONE TABLET BY MOUTH ONCE DAILY     aspirin 81 MG tablet  Take 81 mg by mouth daily.     atorvastatin 40 MG tablet  Commonly known as:  LIPITOR  Take 1 tablet (40 mg total) by mouth daily.     irbesartan 150 MG tablet  Commonly known as:  AVAPRO  Take 1 tablet (150 mg total) by mouth  daily.     nitroGLYCERIN 0.4 MG SL tablet  Commonly known as:  NITROSTAT  Place 1 tablet (0.4 mg total) under the tongue every 5 (five) minutes as needed for chest pain.     VITAMIN B 12 PO  Take by mouth daily.           Objective:   Physical Exam BP 117/67  Pulse 70  Temp(Src) 97.9 F (36.6 C)  Wt 252 lb (114.306 kg)  SpO2 93% General -- alert, well-developed, NAD.  Neck --no thyromegaly , normal carotid pulse, no LAD or supraclavicular mass  HEENT-- Not pale.  Lungs -- normal respiratory effort, no intercostal retractions, no accessory muscle use, and normal breath sounds.  Heart-- normal rate, regular rhythm, no murmur.  Abdomen-- Not distended, good bowel sounds,soft, non-tender. Extremities-- trace peri-ankle edema bilaterally  Neurologic--  alert & oriented X3.  Gait slt unsteady. Strength : appropriate at UE, decreased at lower extremities proximaly R>L  DTRs symmetric  EOMI, PERLA   Psych-- Cognition and judgment appear intact. Cooperative with normal attention span  and concentration. No anxious or depressed appearing.      Assessment & Plan:

## 2013-10-20 NOTE — Patient Instructions (Addendum)
Get your blood work before you leave   Next visit is for routine check up    in 4 weeks  No need to come back fasting Please make an appointment     Please get your x-ray at the other [facility]  office located at: [place], across from [facility].  Please go to the basement, this is a walk-in facility, they are open from 8:30 to 5:30 PM. Phone number [phone].  Get the XR at [facility] and [address] (10 minutes form here); they are open 24/7 [place], [place] [address] [phone]

## 2013-10-21 LAB — RPR

## 2013-10-22 ENCOUNTER — Other Ambulatory Visit: Payer: Self-pay | Admitting: Internal Medicine

## 2013-10-22 DIAGNOSIS — R7989 Other specified abnormal findings of blood chemistry: Secondary | ICD-10-CM

## 2013-10-22 DIAGNOSIS — R945 Abnormal results of liver function studies: Principal | ICD-10-CM

## 2013-10-22 DIAGNOSIS — G729 Myopathy, unspecified: Secondary | ICD-10-CM

## 2013-10-23 ENCOUNTER — Ambulatory Visit (HOSPITAL_BASED_OUTPATIENT_CLINIC_OR_DEPARTMENT_OTHER)
Admission: RE | Admit: 2013-10-23 | Discharge: 2013-10-23 | Disposition: A | Discharge status: Home / Self Care | Payer: Medicare Other | Source: Ambulatory Visit | Attending: Internal Medicine | Admitting: Internal Medicine

## 2013-10-23 ENCOUNTER — Telehealth: Payer: Self-pay | Admitting: *Deleted

## 2013-10-23 ENCOUNTER — Telehealth: Payer: Self-pay | Admitting: Internal Medicine

## 2013-10-23 DIAGNOSIS — Z0189 Encounter for other specified special examinations: Secondary | ICD-10-CM | POA: Insufficient documentation

## 2013-10-23 DIAGNOSIS — IMO0001 Reserved for inherently not codable concepts without codable children: Secondary | ICD-10-CM

## 2013-10-23 DIAGNOSIS — R29898 Other symptoms and signs involving the musculoskeletal system: Secondary | ICD-10-CM

## 2013-10-23 LAB — VITAMIN D 1,25 DIHYDROXY
VITAMIN D3 1, 25 (OH): 26 pg/mL
Vitamin D 1, 25 (OH)2 Total: 26 pg/mL (ref 18–72)

## 2013-10-23 LAB — ACUTE HEP PANEL AND HEP B SURFACE AB
HCV Ab: NEGATIVE
HEP A IGM: NONREACTIVE
HEP B S AB: NEGATIVE
Hep B C IgM: NONREACTIVE
Hepatitis B Surface Ag: NEGATIVE

## 2013-10-23 LAB — CK: Total CK: 18266 U/L (ref 7–232)

## 2013-10-23 NOTE — Addendum Note (Signed)
Addended by: Kathlene November E on: 10/23/2013 01:29 PM   Modules accepted: Orders

## 2013-10-23 NOTE — Telephone Encounter (Addendum)
Labs reviewed, LFTs were elevated, subsequently a hepatitis panel and CKs were ordered, hepatitis panel negative, CK quite elevated. I spoke with the patient, he is doing about the same, denies edema, gross hematuria. Denies any rash or muscle aches per se. He already has an appointment to see neurology but will discuss the results with them ASAP. Will add a sedimentation rate Addendum--discussed with neurology. In addition to discontinue Lipitor we'll get a UPEP, urine myoglobin. Patient to keep the appointment with neurology, drink plenty of fluids, call if symptom don't improve after discontinue Lipitor, RTC if  see any changes in the color of the urine, lower extremity edema. Patient in agreement.

## 2013-10-23 NOTE — Telephone Encounter (Signed)
Candice from [facility] lab called with Critical CK lab resutls [place] [address].

## 2013-10-24 LAB — SEDIMENTATION RATE: Sed Rate: 15 mm/hr (ref 0–22)

## 2013-10-24 NOTE — Addendum Note (Signed)
Addended by: Modena Morrow D on: 10/24/2013 03:28 PM   Modules accepted: Orders

## 2013-10-27 ENCOUNTER — Telehealth: Payer: Self-pay

## 2013-10-27 ENCOUNTER — Ambulatory Visit: Payer: Medicare Other | Admitting: Cardiology

## 2013-10-27 LAB — PROTEIN ELECTROPHORESIS, URINE REFLEX
ALPHA-1-GLOBULIN, U: 35.2 %
Albumin: 23 %
Alpha-2-Globulin, U: 17.1 %
BETA GLOBULIN, U: 20.7 %
GAMMA GLOBULIN, U: 4 %
Total Protein, Urine: 143 mg/dL

## 2013-10-27 LAB — IMMUNOFIXATION INTE

## 2013-10-27 NOTE — Telephone Encounter (Signed)
Patient presents to the office to return his 24 hour urine and has questions because he is not feeling any better. States that he is frustrated and he wants to know what's wrong. Advised patient that the labs do not reveal anything at this point. We will process the 24 hour urine and if it is anything urgent he will be notified ASAP. Gave him precautions to go to the ED if he his symptoms progress or worsen. Patient gives a verbal understanding.

## 2013-10-30 ENCOUNTER — Telehealth: Payer: Self-pay | Admitting: Internal Medicine

## 2013-10-30 LAB — ALDOLASE

## 2013-10-30 NOTE — Telephone Encounter (Signed)
Pt saw dr Larose Kells and had labwork and requesting results. Pt would like dr Shawna Orleans to call him back today

## 2013-10-30 NOTE — Telephone Encounter (Signed)
Unable to reach prior to visit  

## 2013-10-30 NOTE — Telephone Encounter (Signed)
Also obtain urinalysis and urine myoglobin level.  Use myopathy and myositis code.

## 2013-10-30 NOTE — Telephone Encounter (Signed)
Discussed lab results with patient.  Patient still feeling weak lower ext and upper ext.  His neurologist appt is not until 3/16 or 3/17.  He stopped taking Lipitor on 3/2.  He is drinking plenty of fluids.   Patient advised to come in office on Wednesday 3/11 for further evaluation.  Jenny Reichmann - can you call him to tomorrow 3/10 and have him go to nearest lab ([facility] ok) and have them draw BMET, LFTs, and CPK.  Use myositis code and 790.4

## 2013-10-31 ENCOUNTER — Other Ambulatory Visit: Payer: Self-pay | Admitting: Internal Medicine

## 2013-10-31 LAB — HEPATIC FUNCTION PANEL
ALT: 492 U/L — AB (ref 0–53)
AST: 487 U/L — ABNORMAL HIGH (ref 0–37)
Albumin: 3.2 g/dL — ABNORMAL LOW (ref 3.5–5.2)
Alkaline Phosphatase: 46 U/L (ref 39–117)
Bilirubin, Direct: 0.2 mg/dL (ref 0.0–0.3)
Indirect Bilirubin: 0.5 mg/dL (ref 0.2–1.2)
TOTAL PROTEIN: 5.9 g/dL — AB (ref 6.0–8.3)
Total Bilirubin: 0.7 mg/dL (ref 0.2–1.2)

## 2013-10-31 LAB — BASIC METABOLIC PANEL
BUN: 11 mg/dL (ref 6–23)
CALCIUM: 9.4 mg/dL (ref 8.4–10.5)
CHLORIDE: 96 meq/L (ref 96–112)
CO2: 34 meq/L — AB (ref 19–32)
CREATININE: 0.64 mg/dL (ref 0.50–1.35)
Glucose, Bld: 90 mg/dL (ref 70–99)
Potassium: 4.5 mEq/L (ref 3.5–5.3)
Sodium: 135 mEq/L (ref 135–145)

## 2013-10-31 LAB — CK: CK TOTAL: 19810 U/L — AB (ref 7–232)

## 2013-10-31 NOTE — Telephone Encounter (Signed)
appt scheduled, lab order faxed to [facility]

## 2013-11-01 ENCOUNTER — Telehealth: Payer: Self-pay | Admitting: Internal Medicine

## 2013-11-01 ENCOUNTER — Other Ambulatory Visit: Payer: Self-pay | Admitting: *Deleted

## 2013-11-01 ENCOUNTER — Encounter: Payer: Self-pay | Admitting: Internal Medicine

## 2013-11-01 ENCOUNTER — Ambulatory Visit (INDEPENDENT_AMBULATORY_CARE_PROVIDER_SITE_OTHER): Payer: Medicare Other | Admitting: Internal Medicine

## 2013-11-01 ENCOUNTER — Inpatient Hospital Stay (HOSPITAL_COMMUNITY)
Admission: EM | Admit: 2013-11-01 | Discharge: 2013-11-04 | DRG: 558 | Disposition: A | Discharge status: Home / Self Care | Payer: Medicare Other | Attending: Internal Medicine | Admitting: Internal Medicine

## 2013-11-01 ENCOUNTER — Emergency Department (HOSPITAL_COMMUNITY): Payer: Medicare Other

## 2013-11-01 ENCOUNTER — Encounter (HOSPITAL_COMMUNITY): Payer: Self-pay | Admitting: Emergency Medicine

## 2013-11-01 VITALS — BP 120/64 | HR 82 | Temp 97.9°F | Resp 20 | Ht 75.0 in | Wt 255.0 lb

## 2013-11-01 DIAGNOSIS — L03019 Cellulitis of unspecified finger: Secondary | ICD-10-CM

## 2013-11-01 DIAGNOSIS — I10 Essential (primary) hypertension: Secondary | ICD-10-CM

## 2013-11-01 DIAGNOSIS — M609 Myositis, unspecified: Secondary | ICD-10-CM

## 2013-11-01 DIAGNOSIS — R609 Edema, unspecified: Secondary | ICD-10-CM

## 2013-11-01 DIAGNOSIS — R29898 Other symptoms and signs involving the musculoskeletal system: Secondary | ICD-10-CM | POA: Diagnosis present

## 2013-11-01 DIAGNOSIS — IMO0001 Reserved for inherently not codable concepts without codable children: Secondary | ICD-10-CM

## 2013-11-01 DIAGNOSIS — R600 Localized edema: Secondary | ICD-10-CM

## 2013-11-01 DIAGNOSIS — M720 Palmar fascial fibromatosis [Dupuytren]: Secondary | ICD-10-CM | POA: Diagnosis present

## 2013-11-01 DIAGNOSIS — E785 Hyperlipidemia, unspecified: Secondary | ICD-10-CM

## 2013-11-01 DIAGNOSIS — E860 Dehydration: Secondary | ICD-10-CM

## 2013-11-01 DIAGNOSIS — M79609 Pain in unspecified limb: Secondary | ICD-10-CM

## 2013-11-01 DIAGNOSIS — R7402 Elevation of levels of lactic acid dehydrogenase (LDH): Secondary | ICD-10-CM

## 2013-11-01 DIAGNOSIS — R7401 Elevation of levels of liver transaminase levels: Secondary | ICD-10-CM

## 2013-11-01 DIAGNOSIS — E538 Deficiency of other specified B group vitamins: Secondary | ICD-10-CM

## 2013-11-01 DIAGNOSIS — R740 Nonspecific elevation of levels of transaminase and lactic acid dehydrogenase [LDH]: Secondary | ICD-10-CM

## 2013-11-01 DIAGNOSIS — R7989 Other specified abnormal findings of blood chemistry: Secondary | ICD-10-CM

## 2013-11-01 DIAGNOSIS — M3320 Polymyositis, organ involvement unspecified: Secondary | ICD-10-CM | POA: Insufficient documentation

## 2013-11-01 DIAGNOSIS — R7309 Other abnormal glucose: Secondary | ICD-10-CM

## 2013-11-01 DIAGNOSIS — I2510 Atherosclerotic heart disease of native coronary artery without angina pectoris: Secondary | ICD-10-CM | POA: Diagnosis present

## 2013-11-01 DIAGNOSIS — L259 Unspecified contact dermatitis, unspecified cause: Secondary | ICD-10-CM

## 2013-11-01 DIAGNOSIS — H9319 Tinnitus, unspecified ear: Secondary | ICD-10-CM

## 2013-11-01 DIAGNOSIS — R945 Abnormal results of liver function studies: Secondary | ICD-10-CM

## 2013-11-01 DIAGNOSIS — Z0000 Encounter for general adult medical examination without abnormal findings: Secondary | ICD-10-CM

## 2013-11-01 DIAGNOSIS — E539 Vitamin B deficiency, unspecified: Secondary | ICD-10-CM

## 2013-11-01 DIAGNOSIS — E669 Obesity, unspecified: Secondary | ICD-10-CM

## 2013-11-01 DIAGNOSIS — M6282 Rhabdomyolysis: Principal | ICD-10-CM | POA: Diagnosis present

## 2013-11-01 DIAGNOSIS — J309 Allergic rhinitis, unspecified: Secondary | ICD-10-CM

## 2013-11-01 LAB — URINALYSIS, ROUTINE W REFLEX MICROSCOPIC
Bilirubin Urine: NEGATIVE
GLUCOSE, UA: NEGATIVE mg/dL
Glucose, UA: NEGATIVE mg/dL
Ketones, ur: NEGATIVE mg/dL
Ketones, ur: 15 mg/dL — AB
LEUKOCYTES UA: NEGATIVE
Nitrite: NEGATIVE
Nitrite: NEGATIVE
PROTEIN: 100 mg/dL — AB
Protein, ur: 100 mg/dL — AB
SPECIFIC GRAVITY, URINE: 1.032 — AB (ref 1.005–1.030)
Specific Gravity, Urine: 1.019 (ref 1.005–1.030)
UROBILINOGEN UA: 1 mg/dL (ref 0.0–1.0)
Urobilinogen, UA: 1 mg/dL (ref 0.0–1.0)
pH: 5.5 (ref 5.0–8.0)
pH: 5 (ref 5.0–8.0)

## 2013-11-01 LAB — URINE MICROSCOPIC-ADD ON

## 2013-11-01 LAB — CBC WITH DIFFERENTIAL/PLATELET
Basophils Absolute: 0.1 10*3/uL (ref 0.0–0.1)
Basophils Relative: 1 % (ref 0–1)
EOS ABS: 0.5 10*3/uL (ref 0.0–0.7)
EOS PCT: 4 % (ref 0–5)
HCT: 41.1 % (ref 39.0–52.0)
Hemoglobin: 14.1 g/dL (ref 13.0–17.0)
LYMPHS ABS: 1.7 10*3/uL (ref 0.7–4.0)
Lymphocytes Relative: 14 % (ref 12–46)
MCH: 34.2 pg — AB (ref 26.0–34.0)
MCHC: 34.3 g/dL (ref 30.0–36.0)
MCV: 99.8 fL (ref 78.0–100.0)
MONO ABS: 0.9 10*3/uL (ref 0.1–1.0)
MONOS PCT: 8 % (ref 3–12)
Neutro Abs: 8.6 10*3/uL — ABNORMAL HIGH (ref 1.7–7.7)
Neutrophils Relative %: 73 % (ref 43–77)
Platelets: 367 10*3/uL (ref 150–400)
RBC: 4.12 MIL/uL — ABNORMAL LOW (ref 4.22–5.81)
RDW: 13.6 % (ref 11.5–15.5)
WBC: 11.7 10*3/uL — ABNORMAL HIGH (ref 4.0–10.5)

## 2013-11-01 LAB — COMPREHENSIVE METABOLIC PANEL
ALT: 518 U/L — AB (ref 0–53)
AST: 521 U/L — ABNORMAL HIGH (ref 0–37)
Albumin: 3.2 g/dL — ABNORMAL LOW (ref 3.5–5.2)
Alkaline Phosphatase: 53 U/L (ref 39–117)
BUN: 15 mg/dL (ref 6–23)
CALCIUM: 9.4 mg/dL (ref 8.4–10.5)
CO2: 28 mEq/L (ref 19–32)
CREATININE: 0.68 mg/dL (ref 0.50–1.35)
Chloride: 97 mEq/L (ref 96–112)
GLUCOSE: 99 mg/dL (ref 70–99)
Potassium: 4.7 mEq/L (ref 3.7–5.3)
SODIUM: 139 meq/L (ref 137–147)
Total Bilirubin: 0.6 mg/dL (ref 0.3–1.2)
Total Protein: 6.5 g/dL (ref 6.0–8.3)

## 2013-11-01 LAB — TSH: TSH: 2.719 u[IU]/mL (ref 0.350–4.500)

## 2013-11-01 LAB — URINALYSIS, MICROSCOPIC ONLY
Bacteria, UA: NONE SEEN
Casts: NONE SEEN
Crystals: NONE SEEN
Squamous Epithelial / LPF: NONE SEEN

## 2013-11-01 LAB — T4, FREE: FREE T4: 1.27 ng/dL (ref 0.80–1.80)

## 2013-11-01 LAB — CK: CK TOTAL: 18126 U/L — AB (ref 7–232)

## 2013-11-01 MED ORDER — ONDANSETRON HCL 4 MG/2ML IJ SOLN: 4.0000 mg | Freq: Three times a day (TID) | INTRAMUSCULAR | Status: AC | PRN

## 2013-11-01 MED ORDER — SODIUM CHLORIDE 0.9 % IV BOLUS (SEPSIS)
1000.0000 mL | Freq: Once | INTRAVENOUS | Status: AC
  Administered 2013-11-01: 1000 mL via INTRAVENOUS

## 2013-11-01 MED ORDER — SODIUM CHLORIDE 0.9 % IV SOLN
INTRAVENOUS | Status: DC
  Administered 2013-11-02: via INTRAVENOUS

## 2013-11-01 MED ORDER — METHYLPREDNISOLONE SODIUM SUCC 125 MG IJ SOLR
125.0000 mg | Freq: Once | INTRAMUSCULAR | Status: AC
  Administered 2013-11-01: 125 mg via INTRAVENOUS
  Filled 2013-11-01: qty 2

## 2013-11-01 NOTE — Patient Instructions (Signed)
Follow up 7-10 days after you are discharged from the hospital. Proceed to [facility] Emergency room as directed.

## 2013-11-01 NOTE — ED Notes (Signed)
Patient is here from [facility] office.  Dr Shawna Orleans at [phone],  Patient with reported weakness and uri 02-27,  1800 ck,  Elevated lft.  Patient needs IV fluids and solumedrol for myositic.

## 2013-11-01 NOTE — Telephone Encounter (Signed)
Relevant patient education mailed to patient.  

## 2013-11-01 NOTE — ED Notes (Signed)
Nurse First Rounds : Nurse explained delay , process and wait time to pt.

## 2013-11-01 NOTE — Progress Notes (Signed)
Subjective:    Patient ID: Austin Keller, male    DOB: 06/21/1946, 68 y.o.   MRN: [id number]  HPI  68 year old white male with history of nonobstructive coronary artery disease, hypertension and hyperlipidemia for followup regarding progressive weakness.  Patient reports he had upper respiratory infection made February of 2015. Wife had similar illness. She was not diagnosed with influenza. Patient describes experiencing upper respiratory congestion and cough. His symptoms seemed to resolve with over-the-counter Mucinex, Robitussin DM and rest. His symptoms lasted 4-5 days. After his upper throat illness, patient started to feel progressively weaker. He noticed difficulty getting in and out of his car and walking to the supermarket. His symptoms worsened and he he was seen by Dr. Larose Kells 10/20/2013.  Patient completed blood work which showed elevated liver enzymes and significantly elevated CPK of greater than 18,000. Dr. Larose Kells discussed clinical scenario with neurologist at [facility]. His symptoms felt to be secondary to statin myositis and/or viral myositis.  Patient discontinued Lipitor 40 mg on 10/23/2013. Patient was instructed to increase fluid intake which he has been compliant with. He reports pitting edema of his lower extremities that started 1 week ago. He has difficulty walking short distances. He also describes a funny sensation in his hip girdle area. He has trouble climbing stairs.  He does not have any resting muscle pain but describes soreness of his tendons especially when getting out of a chair.  Last week his urine had darker appearance but that has resolved this week.  During his initial illness he felt chills and night sweats but that only lasted during acute episode. He has no further issues with fever or chills. He denies persistent cough. He has dyspnea with exertion but no chest pain.  He denies taking any new medications including antibiotics or herbal supplements.  Lab Results   Component Value Date   NA 135 10/31/2013   K 4.5 10/31/2013   CL 96 10/31/2013   CO2 34* 10/31/2013   Lab Results  Component Value Date   CREATININE 0.64 10/31/2013   Lab Results  Component Value Date   ALT 492* 10/31/2013   AST 487* 10/31/2013   ALKPHOS 46 10/31/2013   BILITOT 0.7 10/31/2013   Lab Results  Component Value Date   CKTOTAL 06269* 10/31/2013    Review of Systems No headaches, no dark urine.  No dizziness    Past Medical History  Diagnosis Date  . Hyperlipidemia   . Dupuytren's contracture of hand 08/2011    left  . Speech disorder   . Hypertension     under control; has been on med. x 2-3 yrs.  Marland Kitchen CAD (coronary artery disease) 10/02/11    Moderate LAD disease with calcification. No obstructive CAD. LV function was normal.   . Asthma     as a child    History   Social History  . Marital Status: Married    Spouse Name: N/A    Number of Children: 3  . Years of Education: N/A   Occupational History  . 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 Smoking status: Former Smoker    Types: Cigarettes    Quit date: 12/06/1997  . Smokeless tobacco: Never Used     Comment: quit smoking 12-15 yrs. ago  . Alcohol Use: 7.0 oz/week    14 drink(s) per week     Comment: 2 drinks/day  . Drug Use: No  . Sexual  Activity: Not on file   Other Topics Concern  . Not on file   Social History Narrative   Wife is 7 yrs younger- works in [employer]    Past Surgical History  Procedure Laterality Date  . Tonsillectomy      AS CHILD  . Hand surgery      left  . Colonoscopy    . Cardiac catheterization  10/02/2011    Moderate LAD disease with calcification. No obstructive CAD. Normal LV function.  . Dupuytren contracture release  11/10/2011    Procedure: DUPUYTREN CONTRACTURE RELEASE;  Surgeon: Cammie Sickle., MD;  Location: [place];  Service: Orthopedics;  Laterality: Left;  palm, long, ring, small, thumb and index web left hand       Family History  Problem Relation Age of Onset  . Hip fracture Mother   . Ulcers Father     gastric    No Known Allergies  Current Outpatient Prescriptions on File Prior to Visit  Medication Sig Dispense Refill  . amLODipine (NORVASC) 5 MG tablet TAKE ONE TABLET BY MOUTH ONCE DAILY  60 tablet  5  . aspirin 81 MG tablet Take 81 mg by mouth daily.      . Cyanocobalamin (VITAMIN B 12 PO) Take by mouth daily.      . irbesartan (AVAPRO) 150 MG tablet Take 1 tablet (150 mg total) by mouth daily.  90 tablet  1  . nitroGLYCERIN (NITROSTAT) 0.4 MG SL tablet Place 1 tablet (0.4 mg total) under the tongue every 5 (five) minutes as needed for chest pain.  25 tablet  3   No current facility-administered medications on file prior to visit.    BP 120/64  Pulse 82  Temp(Src) 97.9 F (36.6 C) (Oral)  Resp 20  Ht 6\' 3"  (1.905 m)  Wt 255 lb (115.667 kg)  BMI 31.87 kg/m2    Objective:   Physical Exam  Constitutional: He is oriented to person, place, and time. He appears well-developed and well-nourished. No distress.  HENT:  Head: Normocephalic and atraumatic.  Right Ear: External ear normal.  Left Ear: External ear normal.  Mouth/Throat: Oropharynx is clear and moist.  Eyes: Conjunctivae and EOM are normal. Pupils are equal, round, and reactive to light.  No conjunctival petechiae  Neck: Neck supple.  No carotid bruit  Cardiovascular: Normal rate, regular rhythm and normal heart sounds.   No murmur heard. Pulmonary/Chest: Effort normal and breath sounds normal.  Abdominal: Soft. Bowel sounds are normal. He exhibits no distension and no mass. There is no tenderness.  Musculoskeletal: He exhibits edema.  Bilateral pitting edema up to knees. Decreased muscle strength of proximal muscles of upper and lower extremities. 3 out of 5.  Difficulty getting up from chair and from laying position.  Lymphadenopathy:    He has no cervical adenopathy.  Neurological: He is alert and oriented to  person, place, and time. He displays normal reflexes. No cranial nerve deficit. He exhibits normal muscle tone.  Skin: Skin is warm and dry.  Psychiatric: He has a normal mood and affect. His behavior is normal.          Assessment & Plan:

## 2013-11-01 NOTE — ED Notes (Signed)
Family member to triage requesting if we could give pt water or snacks. Advised we are unable before seeing MD.

## 2013-11-01 NOTE — ED Notes (Signed)
The pt has had weakness and fatigue for 3 weeks intermittently.  He has been seeing his doctor and was seen today and told to come here for admission.  He has also had recurring bronchitis.

## 2013-11-01 NOTE — H&P (Addendum)
[facility]                                                                                    Patient Demographics  Austin Keller, is a 68 y.o. male  CSN: [id number]  MRN: [id number]  DOB - 03/03/1946  Admit Date - 11/01/2013  Outpatient Primary MD for the patient is Drema Pry, DO   With History of -  Past Medical History  Diagnosis Date  . Hyperlipidemia   . Dupuytren's contracture of hand 08/2011    left  . Speech disorder   . Hypertension     under control; has been on med. x 2-3 yrs.  Marland Kitchen CAD (coronary artery disease) 10/02/11    Moderate LAD disease with calcification. No obstructive CAD. LV function was normal.   . Asthma     as a child      Past Surgical History  Procedure Laterality Date  . Tonsillectomy      AS CHILD  . Hand surgery      left  . Colonoscopy    . Cardiac catheterization  10/02/2011    Moderate LAD disease with calcification. No obstructive CAD. Normal LV function.  . Dupuytren contracture release  11/10/2011    Procedure: DUPUYTREN CONTRACTURE RELEASE;  Surgeon: Cammie Sickle., MD;  Location: [place];  Service: Orthopedics;  Laterality: Left;  palm, long, ring, small, thumb and index web left hand     in for   Chief Complaint  Patient presents with  . wealness      HPI  Austin Keller  is a 68 y.o. male, with past medical history significant for hyperlipidemia, hypertension, coronary artery disease and left hand Dupuytren's contracture presenting today with a 4 weeks history of progressive proximal lower extremity weakness and muscle cramps. His Lipitor was stopped around one week ago however he continued to get worse and a CK level done at the primary medical doctor's office was elevated . the patient was brought here for evaluation . Patient denies chest pains or shortness of breath. Denies nausea vomiting or diarrhea.    Review of Systems    In addition to the HPI above,  No Fever-chills, No  Headache, No changes with Vision or hearing, No problems swallowing food or Liquids, No Chest pain, Cough or Shortness of Breath, No Abdominal pain, No Nausea or Vommitting, Bowel movements are regular, No dysuria, No new skin rashes or bruises, No recent weight gain or loss, No polyuria, polydypsia or polyphagia, No significant Mental Stressors.  A full 10 point Review of Systems was done, except as stated above, all other Review of Systems were negative.   Social History History  Substance Use Topics  . Smoking status: Former Smoker    Types: Cigarettes    Quit date: 12/06/1997  . Smokeless tobacco: Never Used     Comment: quit smoking 12-15 yrs. ago  . Alcohol Use: 7.0 oz/week    14 drink(s) per week     Comment: 2 drinks/day     Family History Family History  Problem Relation Age of Onset  . Hip fracture Mother   . Ulcers Father  gastric     Prior to Admission medications   Medication Sig Start Date End Date Taking? Authorizing Provider  amLODipine (NORVASC) 5 MG tablet Take 5 mg by mouth at bedtime.    Yes Historical Provider, MD  aspirin 81 MG tablet Take 81 mg by mouth daily.   Yes Historical Provider, MD  Cyanocobalamin (VITAMIN B 12 PO) Take 1 tablet by mouth at bedtime.    Yes Historical Provider, MD  guaiFENesin (MUCINEX) 600 MG 12 hr tablet Take 600 mg by mouth 2 (two) times daily as needed for cough or to loosen phlegm.   Yes Historical Provider, MD  guaiFENesin-dextromethorphan (ROBITUSSIN DM) 100-10 MG/5ML syrup Take 10 mLs by mouth every 4 (four) hours as needed for cough.   Yes Historical Provider, MD  irbesartan (AVAPRO) 150 MG tablet Take 1 tablet (150 mg total) by mouth daily. 07/07/13  Yes Doe-Hyun R Shawna Orleans, DO  nitroGLYCERIN (NITROSTAT) 0.4 MG SL tablet Place 1 tablet (0.4 mg total) under the tongue every 5 (five) minutes as needed for chest pain. 01/17/13  Yes Burtis Junes, NP    No Known Allergies  Physical Exam  Vitals  Blood pressure  136/72, pulse 83, temperature 98.6 F (37 C), temperature source Oral, resp. rate 23, height 6\' 3"  (1.905 m), weight 115.667 kg (255 lb), SpO2 95.00%.   1. General elderly white American male, very pleasant in no acute distress but anxious about his situation  2. Normal affect and insight, Not Suicidal or Homicidal, Awake Alert, Oriented X 3.  3. No F.N deficits, ALL C.Nerves Intact, Strength 5/5 all 4 extremities, Sensation intact all 4 extremities, Plantars down going.  4. Ears and Eyes appear Normal, Conjunctivae clear, PERRLA. Moist Oral Mucosa.  5. Supple Neck, No JVD, No cervical lymphadenopathy appriciated, No Carotid Bruits.  6. Symmetrical Chest wall movement, Good air movement bilaterally, CTAB.  7. RRR, No Gallops, Rubs or Murmurs, No Parasternal Heave.  8. Positive Bowel Sounds, Abdomen Soft, Non tender, No organomegaly appriciated,No rebound -guarding or rigidity.  9.  No Cyanosis, Normal Skin Turgor, No Skin Rash or Bruise.  10. Good muscle tone,  joints appear normal , no effusions, Normal ROM.  11. No Palpable Lymph Nodes in Neck or Axillae    Data Review  CBC  Recent Labs Lab 11/01/13 1621  WBC 11.7*  HGB 14.1  HCT 41.1  PLT 367  MCV 99.8  MCH 34.2*  MCHC 34.3  RDW 13.6  LYMPHSABS 1.7  MONOABS 0.9  EOSABS 0.5  BASOSABS 0.1   ------------------------------------------------------------------------------------------------------------------  Chemistries   Recent Labs Lab 10/31/13 1524 11/01/13 1621  NA 135 139  K 4.5 4.7  CL 96 97  CO2 34* 28  GLUCOSE 90 99  BUN 11 15  CREATININE 0.64 0.68  CALCIUM 9.4 9.4  AST 487* 521*  ALT 492* 518*  ALKPHOS 46 53  BILITOT 0.7 0.6   ------------------------------------------------------------------------------------------------------------------ estimated creatinine clearance is 119.6 ml/min (by C-G formula based on Cr of  0.68). ------------------------------------------------------------------------------------------------------------------  Recent Labs  10/31/13 1524  TSH 2.719       ---------------------------------------------------------------------------------------------------------------  Urinalysis    Component Value Date/Time   COLORURINE AMBER* 11/01/2013 1625   APPEARANCEUR CLOUDY* 11/01/2013 1625   LABSPEC 1.032* 11/01/2013 1625   PHURINE 5.0 11/01/2013 1625   GLUCOSEU NEGATIVE 11/01/2013 1625   HGBUR LARGE* 11/01/2013 1625   BILIRUBINUR MODERATE* 11/01/2013 1625   KETONESUR 15* 11/01/2013 1625   PROTEINUR 100* 11/01/2013 1625   UROBILINOGEN 1.0 11/01/2013 1625  NITRITE NEGATIVE 11/01/2013 1625   LEUKOCYTESUR TRACE* 11/01/2013 1625    ----------------------------------------------------------------------------------------------------------------     Imaging results:   Dg Chest 2 View  10/23/2013   CLINICAL DATA:  Paraneoplastic syndrome?  EXAM: CHEST  2 VIEW  COMPARISON:  DG CHEST 2 VIEW dated 01/12/2013  FINDINGS: Low lung volumes. The heart size and mediastinal contours are within normal limits. Both lungs are clear. The visualized skeletal structures are unremarkable.  IMPRESSION: No active cardiopulmonary disease.   Electronically Signed   By: Margaree Mackintosh M.D.   On: 10/23/2013 12:35   Dg Chest Portable 1 View  11/01/2013   CLINICAL DATA Shortness of breath.  EXAM PORTABLE CHEST - 1 VIEW  COMPARISON October 23, 2013.  FINDINGS The heart size and mediastinal contours are within normal limits. Both lungs are clear. No pneumothorax or pleural effusion is noted. The visualized skeletal structures are unremarkable.  IMPRESSION No acute cardiopulmonary abnormality seen.  SIGNATURE  Electronically Signed   By: Sabino Dick M.D.   On: 11/01/2013 23:13      Assessment & Plan  1. 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 2. Hypertension     DC Avapro for the risk of renal failure and switch to hydralazine 3. Lower extremity swelling     Check lower asymmetry Dopplers 4. Hepatitis probably from Lipitor toxicity     Check liver ultrasound     Check hepatitis panel    DVT Prophylaxis  AM Labs Ordered, also please review Full Orders    Code Status full  Disposition Plan: Home  Time spent in minutes : 32 minutes  Condition GUARDED   @SIGNATURE @

## 2013-11-01 NOTE — Progress Notes (Signed)
Pre-visit discussion using our clinic review tool. No additional management support is needed unless otherwise documented below in the visit note.  

## 2013-11-01 NOTE — Assessment & Plan Note (Signed)
BP is stable.  Continue ARB

## 2013-11-01 NOTE — Assessment & Plan Note (Addendum)
68 year old white male with progressive generalized muscle weakness worse in his proximal muscles. His symptoms likely combination of statin and possible viral myositis. His CPKs are significantly elevated and it is concerning that they are trending upward. I am concerned patient may be at risk for renal injury. I suggest inpatient management with IV fluids and IV steroids.   Clinical scenario was discussed with rheumatologist (Dr. Tobie Lords). We agreed that empiric medium dose of Solu-Medrol may be appropriate.  Consider obtaining titers for Epstein-Barr virus, CMV, coxsackie virus, influenza and HIV.  Consider neuro consult during inpatient evaluation.  Patient was referred to Dr. Posey Pronto and was awaiting out patient appointment.  Added thyroid function tests to yesterday's lab draw.  Sed rate was normal.  UPEP unremarkable.

## 2013-11-01 NOTE — Assessment & Plan Note (Signed)
Transaminitis likely secondary to statin use.  Viral trigger is another consideration since he has tolerated statin use in the past.  Acute hepatitis titers were negative. Continue to monitor.

## 2013-11-01 NOTE — ED Notes (Signed)
He gets son when he has fatigue and he also gets sob then.  None now

## 2013-11-01 NOTE — Assessment & Plan Note (Signed)
Patient has bilateral pitting edema.  Consider check BNP.  Recent chest x ray was negative for vascular congestion.   Defer to hospitalist whether to use low dow IV lasix.  Adequate hydration if first priority considering elevated CPK and potential for renal injury.

## 2013-11-01 NOTE — Assessment & Plan Note (Signed)
Atorvastatin 40 mg discontinued on 10/23/2013.

## 2013-11-02 ENCOUNTER — Inpatient Hospital Stay (HOSPITAL_COMMUNITY): Payer: Medicare Other

## 2013-11-02 DIAGNOSIS — I517 Cardiomegaly: Secondary | ICD-10-CM

## 2013-11-02 DIAGNOSIS — M7989 Other specified soft tissue disorders: Secondary | ICD-10-CM

## 2013-11-02 LAB — CBC
HEMATOCRIT: 41.1 % (ref 39.0–52.0)
Hemoglobin: 13.7 g/dL (ref 13.0–17.0)
MCH: 33.3 pg (ref 26.0–34.0)
MCHC: 33.3 g/dL (ref 30.0–36.0)
MCV: 99.8 fL (ref 78.0–100.0)
Platelets: 332 10*3/uL (ref 150–400)
RBC: 4.12 MIL/uL — ABNORMAL LOW (ref 4.22–5.81)
RDW: 13.5 % (ref 11.5–15.5)
WBC: 7.8 10*3/uL (ref 4.0–10.5)

## 2013-11-02 LAB — COMPREHENSIVE METABOLIC PANEL
ALBUMIN: 2.7 g/dL — AB (ref 3.5–5.2)
ALK PHOS: 46 U/L (ref 39–117)
ALT: 425 U/L — AB (ref 0–53)
AST: 351 U/L — AB (ref 0–37)
BILIRUBIN TOTAL: 0.5 mg/dL (ref 0.3–1.2)
BUN: 12 mg/dL (ref 6–23)
CO2: 30 mEq/L (ref 19–32)
Calcium: 8.7 mg/dL (ref 8.4–10.5)
Chloride: 98 mEq/L (ref 96–112)
Creatinine, Ser: 0.62 mg/dL (ref 0.50–1.35)
GFR calc Af Amer: >90 mL/min (ref >90)
GFR calc non Af Amer: >90 mL/min (ref >90)
Glucose, Bld: 204 mg/dL — ABNORMAL HIGH (ref 70–99)
POTASSIUM: 4.7 meq/L (ref 3.7–5.3)
SODIUM: 138 meq/L (ref 137–147)
TOTAL PROTEIN: 5.7 g/dL — AB (ref 6.0–8.3)

## 2013-11-02 LAB — PRO B NATRIURETIC PEPTIDE: Pro B Natriuretic peptide (BNP): 370.1 pg/mL — ABNORMAL HIGH (ref 0–125)

## 2013-11-02 LAB — CK: Total CK: 13852 U/L — ABNORMAL HIGH (ref 7–232)

## 2013-11-02 MED ORDER — ENOXAPARIN SODIUM 40 MG/0.4ML ~~LOC~~ SOLN
40.0000 mg | SUBCUTANEOUS | Status: DC
  Administered 2013-11-02 – 2013-11-03 (×2): 40 mg via SUBCUTANEOUS
  Filled 2013-11-02 (×3): qty 0.4

## 2013-11-02 MED ORDER — GUAIFENESIN-DM 100-10 MG/5ML PO SYRP: 10.0000 mL | ORAL_SOLUTION | ORAL | Status: DC | PRN

## 2013-11-02 MED ORDER — ONDANSETRON HCL 4 MG PO TABS: 4.0000 mg | ORAL_TABLET | Freq: Four times a day (QID) | ORAL | Status: DC | PRN

## 2013-11-02 MED ORDER — HYDRALAZINE HCL 10 MG PO TABS
10.0000 mg | ORAL_TABLET | Freq: Three times a day (TID) | ORAL | Status: DC
  Administered 2013-11-02 – 2013-11-03 (×5): 10 mg via ORAL
  Filled 2013-11-02 (×11): qty 1

## 2013-11-02 MED ORDER — METHYLPREDNISOLONE SODIUM SUCC 125 MG IJ SOLR
60.0000 mg | Freq: Two times a day (BID) | INTRAMUSCULAR | Status: DC
  Administered 2013-11-02: 60 mg via INTRAVENOUS
  Filled 2013-11-02 (×2): qty 0.96

## 2013-11-02 MED ORDER — NITROGLYCERIN 0.4 MG SL SUBL: 0.4000 mg | SUBLINGUAL_TABLET | SUBLINGUAL | Status: DC | PRN

## 2013-11-02 MED ORDER — SODIUM BICARBONATE 8.4 % IV SOLN
INTRAVENOUS | Status: DC
  Administered 2013-11-02 – 2013-11-04 (×6): via INTRAVENOUS
  Filled 2013-11-02 (×10): qty 150

## 2013-11-02 MED ORDER — AMLODIPINE BESYLATE 5 MG PO TABS
5.0000 mg | ORAL_TABLET | Freq: Every day | ORAL | Status: DC
  Administered 2013-11-02 – 2013-11-03 (×3): 5 mg via ORAL
  Filled 2013-11-02 (×4): qty 1

## 2013-11-02 MED ORDER — OXYCODONE HCL 5 MG PO TABS: 5.0000 mg | ORAL_TABLET | ORAL | Status: DC | PRN

## 2013-11-02 MED ORDER — GUAIFENESIN ER 600 MG PO TB12: 600.0000 mg | ORAL_TABLET | Freq: Two times a day (BID) | ORAL | Status: DC | PRN

## 2013-11-02 MED ORDER — ONDANSETRON HCL 4 MG/2ML IJ SOLN: 4.0000 mg | Freq: Four times a day (QID) | INTRAMUSCULAR | Status: DC | PRN

## 2013-11-02 MED ORDER — VITAMIN B-1 100 MG PO TABS
100.0000 mg | ORAL_TABLET | Freq: Every day | ORAL | Status: DC
  Administered 2013-11-03: 100 mg via ORAL
  Filled 2013-11-02 (×2): qty 1

## 2013-11-02 MED ORDER — ASPIRIN 81 MG PO CHEW
81.0000 mg | CHEWABLE_TABLET | Freq: Every day | ORAL | Status: DC
  Administered 2013-11-02 – 2013-11-03 (×2): 81 mg via ORAL
  Filled 2013-11-02 (×3): qty 1

## 2013-11-02 NOTE — ED Provider Notes (Signed)
CSN: [id number]     Arrival date & time 11/01/13  1608 History   First MD Initiated Contact with Patient 11/01/13 2141     Chief Complaint  Patient presents with  . Kemper Durie      (Consider location/radiation/quality/duration/timing/severity/associated sxs/prior Treatment) HPI Comments: 68 yo male with lipids, myositis presents with LE weakness/ muscle tightness worsening the past 3 wks, pt has elevated LFTs, recently taken off cholesterol medicines however sxs worsening.  No fevers.  Pt unable to walk more than 30 ft without legs becoming weak. Minimal UE involvement.  No urinary changes. Worsening LE edema bilateral, no recent surgery, no CHF hx. Worse at the end of the day.   The history is provided by the patient.    Past Medical History  Diagnosis Date  . Hyperlipidemia   . Dupuytren's contracture of hand 08/2011    left  . Speech disorder   . Hypertension     under control; has been on med. x 2-3 yrs.  Marland Kitchen CAD (coronary artery disease) 10/02/11    Moderate LAD disease with calcification. No obstructive CAD. LV function was normal.   . Asthma     as a child   Past Surgical History  Procedure Laterality Date  . Tonsillectomy      AS CHILD  . Hand surgery      left  . Colonoscopy    . Cardiac catheterization  10/02/2011    Moderate LAD disease with calcification. No obstructive CAD. Normal LV function.  . Dupuytren contracture release  11/10/2011    Procedure: DUPUYTREN CONTRACTURE RELEASE;  Surgeon: Cammie Sickle., MD;  Location: [place];  Service: Orthopedics;  Laterality: Left;  palm, long, ring, small, thumb and index web left hand    Family History  Problem Relation Age of Onset  . Hip fracture Mother   . Ulcers Father     gastric   History  Substance Use Topics  . Smoking status: Former Smoker    Types: Cigarettes    Quit date: 12/06/1997  . Smokeless tobacco: Never Used     Comment: quit smoking 12-15 yrs. ago  . Alcohol Use: 7.0 oz/week     14 drink(s) per week     Comment: 2 drinks/day    Review of Systems  Constitutional: Positive for appetite change. Negative for fever and chills.  HENT: Negative for congestion.   Eyes: Negative for visual disturbance.  Respiratory: Negative for shortness of breath.   Cardiovascular: Positive for leg swelling. Negative for chest pain.  Gastrointestinal: Negative for vomiting and abdominal pain.  Genitourinary: Negative for dysuria and flank pain.  Musculoskeletal: Positive for myalgias. Negative for back pain, joint swelling, neck pain and neck stiffness.  Skin: Negative for rash.  Neurological: Positive for weakness and light-headedness. Negative for headaches.      Allergies  Review of patient's allergies indicates no known allergies.  Home Medications  No current outpatient prescriptions on file. BP 139/55  Pulse 84  Temp(Src) 98.8 F (37.1 C) (Oral)  Resp 18  Ht 6\' 3"  (1.905 m)  Wt 254 lb 3.1 oz (115.3 kg)  BMI 31.77 kg/m2  SpO2 96% Physical Exam  Nursing note and vitals reviewed. Constitutional: He is oriented to person, place, and time. He appears well-developed and well-nourished.  HENT:  Head: Normocephalic and atraumatic.  Dry mm  Eyes: Conjunctivae are normal. Right eye exhibits no discharge. Left eye exhibits no discharge.  Neck: Normal range of motion. Neck supple. No  tracheal deviation present.  Cardiovascular: Normal rate and regular rhythm.   Pulmonary/Chest: Effort normal and breath sounds normal.  Abdominal: Soft. He exhibits no distension. There is no tenderness. There is no guarding.  Musculoskeletal: He exhibits edema (bilateral 3+ LE mild pitting). He exhibits no tenderness.  Neurological: He is alert and oriented to person, place, and time. GCS eye subscore is 4. GCS verbal subscore is 5. GCS motor subscore is 6.  5+ strength in UE and 4+ LE with f/e at major joints. Sensation to palpation intact in UE and LE. CNs 2-12 grossly intact.  EOMFI.   PERRL.   Finger nose and coordination intact bilateral.   Visual fields intact to finger testing.   Skin: Skin is warm. No rash noted.  Psychiatric: He has a normal mood and affect.    ED Course  Procedures (including critical care time) Labs Review Labs Reviewed  URINALYSIS, ROUTINE W REFLEX MICROSCOPIC - Abnormal; Notable for the following:    Color, Urine AMBER (*)    APPearance CLOUDY (*)    Specific Gravity, Urine 1.032 (*)    Hgb urine dipstick LARGE (*)    Bilirubin Urine MODERATE (*)    Ketones, ur 15 (*)    Protein, ur 100 (*)    Leukocytes, UA TRACE (*)    All other components within normal limits  CBC WITH DIFFERENTIAL - Abnormal; Notable for the following:    WBC 11.7 (*)    RBC 4.12 (*)    MCH 34.2 (*)    Neutro Abs 8.6 (*)    All other components within normal limits  COMPREHENSIVE METABOLIC PANEL - Abnormal; Notable for the following:    Albumin 3.2 (*)    AST 521 (*)    ALT 518 (*)    All other components within normal limits  URINE MICROSCOPIC-ADD ON - Abnormal; Notable for the following:    Bacteria, UA MANY (*)    Casts GRANULAR CAST (*)    All other components within normal limits  CK - Abnormal; Notable for the following:    Total CK 18126 (*)    All other components within normal limits  PRO B NATRIURETIC PEPTIDE - Abnormal; Notable for the following:    Pro B Natriuretic peptide (BNP) 370.1 (*)    All other components within normal limits  CBC  CREATININE, SERUM  COMPREHENSIVE METABOLIC PANEL  CK   Imaging Review Dg Chest Portable 1 View  11/01/2013   CLINICAL DATA Shortness of breath.  EXAM PORTABLE CHEST - 1 VIEW  COMPARISON October 23, 2013.  FINDINGS The heart size and mediastinal contours are within normal limits. Both lungs are clear. No pneumothorax or pleural effusion is noted. The visualized skeletal structures are unremarkable.  IMPRESSION No acute cardiopulmonary abnormality seen.  SIGNATURE  Electronically Signed   By: Sabino Dick  M.D.   On: 11/01/2013 23:13     EKG Interpretation None      MDM   Final diagnoses:  Rhabdomyolysis  Dehydration  LFT elevation  Bilateral leg edema   Clinically rhabdomyolysis.  Fluid bolus and steroid dose given. Likely due to recent cholesterol medication. Spoke with [facility] for admission.  The patients results and plan were reviewed and discussed.   Any x-rays performed were personally reviewed by myself.   Differential diagnosis were considered with the presenting HPI.   Admission/ observation were discussed with the admitting physician, patient and/or family and they are comfortable with the plan.     Vonna Kotyk  Lucia Estelle, MD 11/02/13 [phone]

## 2013-11-02 NOTE — Progress Notes (Signed)
VASCULAR LAB PRELIMINARY  PRELIMINARY  PRELIMINARY  PRELIMINARY  Bilateral lower extremity venous Dopplers completed.    Preliminary report:  There is no DVT or SVT noted in the bilateral lower extremities.  Manahil Vanzile, RVT 11/02/2013, 9:17 AM

## 2013-11-02 NOTE — Progress Notes (Signed)
Doppler performed on bilat  Legs. Show bilateral strong pulses.

## 2013-11-02 NOTE — Progress Notes (Signed)
Patient admitted after midnight. Chart reviewed. Patient examined. CPK improving. Studies reviewed. Drinks 3 drinks nightly. Denies DT's. Add thiamine. Weak and fatigued for 3 weeks. Will get PT eval.  Doree Barthel, M.D. [facility] [phone]

## 2013-11-03 LAB — BASIC METABOLIC PANEL
BUN: 13 mg/dL (ref 6–23)
CO2: 33 meq/L — AB (ref 19–32)
CREATININE: 0.69 mg/dL (ref 0.50–1.35)
Calcium: 8.5 mg/dL (ref 8.4–10.5)
Chloride: 94 mEq/L — ABNORMAL LOW (ref 96–112)
GFR calc non Af Amer: >90 mL/min (ref >90)
GLUCOSE: 126 mg/dL — AB (ref 70–99)
POTASSIUM: 3.4 meq/L — AB (ref 3.7–5.3)
Sodium: 138 mEq/L (ref 137–147)

## 2013-11-03 LAB — CK: Total CK: 9352 U/L — ABNORMAL HIGH (ref 7–232)

## 2013-11-03 NOTE — Evaluation (Addendum)
Physical Therapy Evaluation/ Discharge Patient Details Name: [name] MRN: [id number] DOB: [birth year] Today's Date: 11/03/2013 Time: 2064-6318 PT Time Calculation (min): 24 min  PT Assessment / Plan / Recommendation History of Present Illness  Austin Keller  is a 68 y.o. male, with past medical history significant for hyperlipidemia, hypertension, coronary artery disease and left hand Dupuytren's contracture presenting today with a 4 weeks history of progressive proximal lower extremity weakness and muscle cramps. His Lipitor was stopped around one week ago however he continued to get worse and a CK level done at the primary medical doctor's office was elevated . the patient was brought here for evaluation   Clinical Impression  Pt functionally moving well and reports fatigue with gait at home although able to ambulate 300' without difficulty but fatigued with hip flexion end of ambulation. Pt with bil UE weakness > bil LE weakness and recommend OPPT to address continued deficits. Pt educated for gravity resisted shoulder flexion, abduction and hip flexion exercises to focus on at this time without getting to the point of being overly fatigued. Pt also encouraged to continue hall ambulation. Pt reports no falls or knee buckling at home and agreeable to OPPT without further acute intervention. Recommend 3 in 1 for assist with bathing and low commode at home. Will sign off.     PT Assessment  All further PT needs can be met in the next venue of care    Follow Up Recommendations  Outpatient PT    Does the patient have the potential to tolerate intense rehabilitation      Barriers to Discharge        Equipment Recommendations  3in1 (PT)    Recommendations for Other Services OT consult   Frequency      Precautions / Restrictions Precautions Precautions: Fall   Pertinent Vitals/Pain No pain      Mobility  Bed Mobility Overal bed mobility: Needs Assistance Bed Mobility:  Rolling;Sidelying to Sit Rolling: Supervision Sidelying to sit: Supervision General bed mobility comments: supervision to roll fully to be able to transfer side to sit as pt unable without cues for translation of upper shoulder Transfers Overall transfer level: Modified independent Ambulation/Gait Ambulation/Gait assistance: Modified independent (Device/Increase time) Ambulation Distance (Feet): 300 Feet Assistive device: None Gait Pattern/deviations: WFL(Within Functional Limits)    Exercises General Exercises - Lower Extremity Hip Flexion/Marching: AROM;Both;Other reps (comment);Seated (2 reps due to fatigue)   PT Diagnosis: Generalized weakness  PT Problem List: Decreased strength;Decreased activity tolerance PT Treatment Interventions:       PT Goals(Current goals can be found in the care plan section) Acute Rehab PT Goals PT Goal Formulation: No goals set, d/c therapy  Visit Information  Last PT Received On: 11/03/13 Assistance Needed: +1 History of Present Illness: Austin Keller  is a 68 y.o. male, with past medical history significant for hyperlipidemia, hypertension, coronary artery disease and left hand Dupuytren's contracture presenting today with a 4 weeks history of progressive proximal lower extremity weakness and muscle cramps. His Lipitor was stopped around one week ago however he continued to get worse and a CK level done at the primary medical doctor's office was elevated . the patient was brought here for evaluation        Prior Functioning  Home Living Family/patient expects to be discharged to:: Private residence Living Arrangements: Spouse/significant other Available Help at Discharge: Available PRN/intermittently Type of Home: House Home Access: Stairs to enter [employer] of Steps: 2 Entrance Stairs-Rails: None Home Layout:  One level Home Equipment: None Prior Function Level of Independence: Independent Communication Communication: No  difficulties    Cognition  Cognition Arousal/Alertness: Awake/alert Behavior During Therapy: WFL for tasks assessed/performed Overall Cognitive Status: Within Functional Limits for tasks assessed    Extremity/Trunk Assessment Upper Extremity Assessment Upper Extremity Assessment: RUE deficits/detail;LUE deficits/detail RUE Deficits / Details: 4/5 elbow flexion and extension, 3/5 shoulder flexion and abduction LUE Deficits / Details: 4/5 elbow flexion and extension, 3/5 shoulder flexion and abduction Lower Extremity Assessment Lower Extremity Assessment: RLE deficits/detail;LLE deficits/detail RLE Deficits / Details: bil LE knee flexion and extension 5/5, hip flexion 3/5, dorsiflexion 5/5 LLE Deficits / Details: bil LE knee flexion and extension 5/5, hip flexion 3/5, dorsiflexion 5/5 Cervical / Trunk Assessment Cervical / Trunk Assessment: Normal   Balance    End of Session PT - End of Session Equipment Utilized During Treatment: Gait belt Activity Tolerance: Patient tolerated treatment well Patient left: in chair;with call bell/phone within reach  GP     Melford Aase 11/03/2013, [place], [place]

## 2013-11-03 NOTE — Progress Notes (Signed)
[facility] PROGRESS NOTE  Austin Keller [id number]:[id number] DOB: 03/27/1946 DOA: 11/01/2013 PCP: Drema Pry, DO  Assessment/Plan:  Principal Problem:   Rhabdomyolysis Active Problems:   HYPERLIPIDEMIA   HYPERTENSION   CAD (coronary artery disease)  CPK improving. Continue IVF. Likely home tomorrow if stable  Code Status:  full Family Communication:  wife Disposition Plan:  home  HPI/Subjective: Feels much better. Stronger, less pain. Urine lighter.  Objective: Filed Vitals:   11/03/13 1415  BP: 118/50  Pulse: 79  Temp: 97.9 F (36.6 C)  Resp: 16    Intake/Output Summary (Last 24 hours) at 11/03/13 1611 Last data filed at 11/03/13 0858  Gross per 24 hour  Intake   2720 ml  Output    975 ml  Net   1745 ml   Filed Weights   11/01/13 1618 11/02/13 0015  Weight: 115.667 kg (255 lb) 115.3 kg (254 lb 3.1 oz)    Exam:   General:  Alert, oriented  Cardiovascular: RRR  Respiratory: CTA  Abdomen: S, nt, nd  Ext: 1 plus edema  Basic Metabolic Panel:  Recent Labs Lab 10/31/13 1524 11/01/13 1621 11/02/13 0702 11/03/13 0345  NA 135 139 138 138  K 4.5 4.7 4.7 3.4*  CL 96 97 98 94*  CO2 34* 28 30 33*  GLUCOSE 90 99 204* 126*  BUN 11 15 12 13   CREATININE 0.64 0.68 0.62 0.69  CALCIUM 9.4 9.4 8.7 8.5   Liver Function Tests:  Recent Labs Lab 10/31/13 1524 11/01/13 1621 11/02/13 0702  AST 487* 521* 351*  ALT 492* 518* 425*  ALKPHOS 46 53 46  BILITOT 0.7 0.6 0.5  PROT 5.9* 6.5 5.7*  ALBUMIN 3.2* 3.2* 2.7*   No results found for this basename: LIPASE, AMYLASE,  in the last 168 hours No results found for this basename: AMMONIA,  in the last 168 hours CBC:  Recent Labs Lab 11/01/13 1621 11/02/13 0702  WBC 11.7* 7.8  NEUTROABS 8.6*  --   HGB 14.1 13.7  HCT 41.1 41.1  MCV 99.8 99.8  PLT 367 332   Cardiac Enzymes:  Recent Labs Lab 10/31/13 1524 11/01/13 2218 11/02/13 0702 11/03/13 0345  CKTOTAL 73419* 18126* 13852* 9352*   BNP  (last 3 results)  Recent Labs  01/12/13 1511 11/01/13 2242  PROBNP 117.0* 370.1*   CBG: No results found for this basename: GLUCAP,  in the last 168 hours  No results found for this or any previous visit (from the past 240 hour(s)).   Studies: US Abdomen Complete  11/02/2013   CLINICAL DATA:  Elevated liver function tests.  EXAM: ULTRASOUND ABDOMEN COMPLETE  COMPARISON:  None.  FINDINGS: Gallbladder:  No gallstones or wall thickening visualized. No sonographic Murphy sign noted.  Common bile duct:  Diameter: 0.5 cm  Liver:  Left hepatic lobe is not visualized due to extensive bowel gas. Right hepatic lobe is slightly echogenic without focal abnormality or biliary dilatation.  IVC:  Not visualized  Pancreas:  Not visualized.  Spleen:  Spleen is poorly visualized but measures 7.3 cm in length without gross abnormality.  Right Kidney:  Length: 11.8 cm. Normal appearance of the renal cortex. There is a 2.7 cm round hypoechoic structure along the right kidney upper pole. Findings are most compatible with a renal cyst. Negative for right hydronephrosis.  Left Kidney:  Length: 12.2 cm. Round hypoechoic structure in the upper pole measures up to 2.2 cm. This structure is not well characterized but probably represents a cyst. There  is a well-defined hypoechoic structure in the lower pole that measures up to 3.0 cm and compatible with a cyst. Negative for left hydronephrosis.  Abdominal aorta:  No aneurysm visualized.  Other findings:  None.  IMPRESSION: Negative for gallstones or biliary dilatation.  The study has limitations due to a large amount of bowel gas.  Visualized liver is echogenic and could represent hepatic steatosis.  Evidence for bilateral renal cysts. Please note that the presumed cysts in the upper poles are poorly characterized.   Electronically Signed   By: Markus Daft M.D.   On: 11/02/2013 17:17   Dg Chest Portable 1 View  11/01/2013   CLINICAL DATA Shortness of breath.  EXAM PORTABLE  CHEST - 1 VIEW  COMPARISON October 23, 2013.  FINDINGS The heart size and mediastinal contours are within normal limits. Both lungs are clear. No pneumothorax or pleural effusion is noted. The visualized skeletal structures are unremarkable.  IMPRESSION No acute cardiopulmonary abnormality seen.  SIGNATURE  Electronically Signed   By: Sabino Dick M.D.   On: 11/01/2013 23:13    Scheduled Meds: . amLODipine  5 mg Oral QHS  . aspirin  81 mg Oral Daily  . enoxaparin (LOVENOX) injection  40 mg Subcutaneous Q24H  . hydrALAZINE  10 mg Oral 3 times per day  . thiamine  100 mg Oral Daily   Continuous Infusions: .  sodium bicarbonate  infusion 1000 mL 125 mL/hr at 11/03/13 1058    Time spent: 15 minutes  [facility] Pager [phone]. If 7PM-7AM, please contact night-coverage at [email or web address], password [facility] 11/03/2013, 4:11 PM  LOS: 2 days

## 2013-11-04 DIAGNOSIS — I10 Essential (primary) hypertension: Secondary | ICD-10-CM

## 2013-11-04 LAB — CK: Total CK: 9284 U/L — ABNORMAL HIGH (ref 7–232)

## 2013-11-04 MED ORDER — POTASSIUM CHLORIDE ER 20 MEQ PO TBCR: 40.0000 meq | EXTENDED_RELEASE_TABLET | Freq: Every day | ORAL | Status: DC

## 2013-11-04 MED ORDER — FUROSEMIDE 40 MG PO TABS: 40.0000 mg | ORAL_TABLET | Freq: Every day | ORAL | Status: DC

## 2013-11-04 NOTE — Progress Notes (Signed)
Pt discharged to home accomp by wife.via wheelchair.  All discharge instructions given and explained to pt and wife.  Rx for lasix and K given and explained.  Pt verbalizes understanding of the new meds for 7 days.  FU appts all reviewed.  Pt is to have outpt PT twice a week.  Wife states that she will set this up, she knows several agencies in [place] where they live or if not will set up in [facility], near to the location where she works.  She stated she felt fully competent to get the PT arranged.  Called Dr. Conley Canal and asked about the Neuro appt for 3/16.  Dr. Conley Canal stated that the pt could call early Monday and cancel the appt if not needed but that she would not be able to cancel it, pt informed of that and understands.

## 2013-11-04 NOTE — Discharge Summary (Signed)
Physician Discharge Summary  Austin Keller [id number] DOB: 1946/07/08 DOA: 11/01/2013  PCP: Drema Pry, DO  Admit date: 11/01/2013 Discharge date: 11/04/2013  Time spent:  Greater than 30 minutes  Recommendations for Outpatient Follow-up:  1. Home PT arranged.  Discharge Diagnoses:  Principal Problem:   Rhabdomyolysis likely statin related Active Problems:   HYPERLIPIDEMIA   HYPERTENSION   CAD (coronary artery disease) leg edema  Discharge Condition: stable  Filed Weights   11/01/13 1618 11/02/13 0015  Weight: 115.667 kg (255 lb) 115.3 kg (254 lb 3.1 oz)    History of present illness:  68 y.o. male, with past medical history significant for hyperlipidemia, hypertension, coronary artery disease and left hand Dupuytren's contracture presenting today with a 4 weeks history of progressive proximal lower extremity weakness and muscle cramps. His Lipitor was stopped around one week ago however he continued to get worse and a CK level done at the primary medical doctor's office was elevated . the patient was brought here for evaluation . Patient denies chest pains or shortness of breath. Denies nausea vomiting or diarrhea.  CPK noted to be above 18,000.  Hospital Course:  Started on IVF, statin stopped.  PT eval completed.  By discharge, pain resolved. Still somewhat weak, but much improved.  Recommend stopping lipitor indefinately  Procedures:  none  Consultations:  PT  Discharge Exam: Filed Vitals:   11/04/13 0510  BP: 109/59  Pulse: 78  Temp: 98.4 F (36.9 C)  Resp: 17    General: alert, oriented, comfortable Cardiovascular: RRR without MGR Respiratory: CTA without WRR Ext 1+ pitting edema  Discharge Instructions  Discharge Orders   Future Appointments Provider Department Dept Phone   11/06/2013 11:00 AM Alda Berthold, MD [facility] [phone]   11/10/2013 9:00 AM Burtis Junes, NP [place] Office [phone]    11/10/2013 2:15 PM [place] [place] at [place]   Future Orders Complete By Expires   Diet - low sodium heart healthy  As directed    Discharge instructions  As directed    Comments:     Outpatient PT twice weekly   Increase activity slowly  As directed        Medication List         amLODipine 5 MG tablet  Commonly known as:  NORVASC  Take 5 mg by mouth at bedtime.     aspirin 81 MG tablet  Take 81 mg by mouth daily.     furosemide 40 MG tablet  Commonly known as:  LASIX  Take 1 tablet (40 mg total) by mouth daily. For 7 days (swelling)     guaiFENesin 600 MG 12 hr tablet  Commonly known as:  MUCINEX  Take 600 mg by mouth 2 (two) times daily as needed for cough or to loosen phlegm.     guaiFENesin-dextromethorphan 100-10 MG/5ML syrup  Commonly known as:  ROBITUSSIN DM  Take 10 mLs by mouth every 4 (four) hours as needed for cough.     irbesartan 150 MG tablet  Commonly known as:  AVAPRO  Take 1 tablet (150 mg total) by mouth daily.     nitroGLYCERIN 0.4 MG SL tablet  Commonly known as:  NITROSTAT  Place 1 tablet (0.4 mg total) under the tongue every 5 (five) minutes as needed for chest pain.     Potassium Chloride ER 20 MEQ Tbcr  Take 40 mEq by mouth daily.     VITAMIN B 12  PO  Take 1 tablet by mouth at bedtime.       No Known Allergies    The results of significant diagnostics from this hospitalization (including imaging, microbiology, ancillary and laboratory) are listed below for reference.    Significant Diagnostic Studies: Dg Chest 2 View  10/23/2013   CLINICAL DATA:  Paraneoplastic syndrome?  EXAM: CHEST  2 VIEW  COMPARISON:  DG CHEST 2 VIEW dated 01/12/2013  FINDINGS: Low lung volumes. The heart size and mediastinal contours are within normal limits. Both lungs are clear. The visualized skeletal structures are unremarkable.  IMPRESSION: No active cardiopulmonary disease.   Electronically Signed   By: Margaree Mackintosh M.D.    On: 10/23/2013 12:35   US Abdomen Complete  11/02/2013   CLINICAL DATA:  Elevated liver function tests.  EXAM: ULTRASOUND ABDOMEN COMPLETE  COMPARISON:  None.  FINDINGS: Gallbladder:  No gallstones or wall thickening visualized. No sonographic Murphy sign noted.  Common bile duct:  Diameter: 0.5 cm  Liver:  Left hepatic lobe is not visualized due to extensive bowel gas. Right hepatic lobe is slightly echogenic without focal abnormality or biliary dilatation.  IVC:  Not visualized  Pancreas:  Not visualized.  Spleen:  Spleen is poorly visualized but measures 7.3 cm in length without gross abnormality.  Right Kidney:  Length: 11.8 cm. Normal appearance of the renal cortex. There is a 2.7 cm round hypoechoic structure along the right kidney upper pole. Findings are most compatible with a renal cyst. Negative for right hydronephrosis.  Left Kidney:  Length: 12.2 cm. Round hypoechoic structure in the upper pole measures up to 2.2 cm. This structure is not well characterized but probably represents a cyst. There is a well-defined hypoechoic structure in the lower pole that measures up to 3.0 cm and compatible with a cyst. Negative for left hydronephrosis.  Abdominal aorta:  No aneurysm visualized.  Other findings:  None.  IMPRESSION: Negative for gallstones or biliary dilatation.  The study has limitations due to a large amount of bowel gas.  Visualized liver is echogenic and could represent hepatic steatosis.  Evidence for bilateral renal cysts. Please note that the presumed cysts in the upper poles are poorly characterized.   Electronically Signed   By: Markus Daft M.D.   On: 11/02/2013 17:17   Dg Chest Portable 1 View  11/01/2013   CLINICAL DATA Shortness of breath.  EXAM PORTABLE CHEST - 1 VIEW  COMPARISON October 23, 2013.  FINDINGS The heart size and mediastinal contours are within normal limits. Both lungs are clear. No pneumothorax or pleural effusion is noted. The visualized skeletal structures are  unremarkable.  IMPRESSION No acute cardiopulmonary abnormality seen.  SIGNATURE  Electronically Signed   By: Sabino Dick M.D.   On: 11/01/2013 23:13    Microbiology: No results found for this or any previous visit (from the past 240 hour(s)).   Labs: Basic Metabolic Panel:  Recent Labs Lab 10/31/13 1524 11/01/13 1621 11/02/13 0702 11/03/13 0345  NA 135 139 138 138  K 4.5 4.7 4.7 3.4*  CL 96 97 98 94*  CO2 34* 28 30 33*  GLUCOSE 90 99 204* 126*  BUN 11 15 12 13   CREATININE 0.64 0.68 0.62 0.69  CALCIUM 9.4 9.4 8.7 8.5   Liver Function Tests:  Recent Labs Lab 10/31/13 1524 11/01/13 1621 11/02/13 0702  AST 487* 521* 351*  ALT 492* 518* 425*  ALKPHOS 46 53 46  BILITOT 0.7 0.6 0.5  PROT 5.9* 6.5 5.7*  ALBUMIN 3.2* 3.2* 2.7*   No results found for this basename: LIPASE, AMYLASE,  in the last 168 hours No results found for this basename: AMMONIA,  in the last 168 hours CBC:  Recent Labs Lab 11/01/13 1621 11/02/13 0702  WBC 11.7* 7.8  NEUTROABS 8.6*  --   HGB 14.1 13.7  HCT 41.1 41.1  MCV 99.8 99.8  PLT 367 332   Cardiac Enzymes:  Recent Labs Lab 10/31/13 1524 11/01/13 2218 11/02/13 0702 11/03/13 0345 11/04/13 0453  CKTOTAL 76283* 18126* 13852* 9352* 9284*   BNP: BNP (last 3 results)  Recent Labs  01/12/13 1511 11/01/13 2242  PROBNP 117.0* 370.1*   CBG: No results found for this basename: GLUCAP,  in the last 168 hours     Signed:  [place] L  [facility] 11/04/2013, 10:08 AM

## 2013-11-04 NOTE — Progress Notes (Signed)
Faxed to [facility]:  Order for home PT, Face to Face encounter form, demographics and ins information.  Pt's wife had called back to me this afternoon and had concerns about his mobility and requested home PT.  Nyra Capes, his wife, info on 3N1.  Dr. Conley Canal contacted who called Bethena Roys and spoke with her about her concerns.  Olga Coaster, on call Case Mgr called and she informed me what items to fax to [facility].  Called Bethena Roys and updated her and gave her the # for [facility] so she can call them Monday if they have not called her by 1100.   Nyra Capes my name if she has any further concerns or questions.

## 2013-11-06 ENCOUNTER — Ambulatory Visit: Payer: Medicare Other | Admitting: Neurology

## 2013-11-07 ENCOUNTER — Other Ambulatory Visit: Payer: Self-pay | Admitting: Internal Medicine

## 2013-11-07 DIAGNOSIS — IMO0001 Reserved for inherently not codable concepts without codable children: Secondary | ICD-10-CM

## 2013-11-07 MED ORDER — PREDNISONE 20 MG PO TABS: 20.0000 mg | ORAL_TABLET | Freq: Every day | ORAL | Status: DC

## 2013-11-07 NOTE — Assessment & Plan Note (Addendum)
Spoke with patient via telephone.  He is feeling somewhat better but still has lower extremity weakness.   He was given IV solumedrol during hospitalization.  I suggest prednisone 20 once daily over next 10 days.  We will plan on repeating CPK and BMET at next office.  He wants to reschedule for 11/13/13.  He is currently working with in home physical therapy.  He has hx of abnormal glucose in the past.  He will ask his wife to stop by our office to pick up glucometer.  He should check his blood sugar once daily (fasting).

## 2013-11-09 ENCOUNTER — Telehealth: Payer: Self-pay

## 2013-11-09 DIAGNOSIS — IMO0001 Reserved for inherently not codable concepts without codable children: Secondary | ICD-10-CM

## 2013-11-09 NOTE — Telephone Encounter (Signed)
Pt discharged to home for PT; needs referral for OT therapy.  Pls advise and call Clair Gulling.

## 2013-11-10 ENCOUNTER — Ambulatory Visit: Payer: Medicare Other | Admitting: Internal Medicine

## 2013-11-10 ENCOUNTER — Ambulatory Visit: Payer: Medicare Other | Admitting: Nurse Practitioner

## 2013-11-10 NOTE — Progress Notes (Signed)
Pt is not taking prednisone.  He stated that Dr Shawna Orleans sort of scared him about it.  He will discuss it with Dr Shawna Orleans on Monday

## 2013-11-10 NOTE — Telephone Encounter (Signed)
[place] for referral for OT.  Use myositis and muscle weakness code.

## 2013-11-10 NOTE — Telephone Encounter (Signed)
Referral order placed.

## 2013-11-13 ENCOUNTER — Ambulatory Visit (INDEPENDENT_AMBULATORY_CARE_PROVIDER_SITE_OTHER): Payer: Medicare Other | Admitting: Internal Medicine

## 2013-11-13 ENCOUNTER — Encounter: Payer: Self-pay | Admitting: Internal Medicine

## 2013-11-13 VITALS — BP 128/70 | HR 72 | Temp 98.0°F | Ht 75.0 in | Wt 248.0 lb

## 2013-11-13 DIAGNOSIS — R7401 Elevation of levels of liver transaminase levels: Secondary | ICD-10-CM

## 2013-11-13 DIAGNOSIS — I10 Essential (primary) hypertension: Secondary | ICD-10-CM

## 2013-11-13 DIAGNOSIS — M6282 Rhabdomyolysis: Secondary | ICD-10-CM

## 2013-11-13 DIAGNOSIS — I2510 Atherosclerotic heart disease of native coronary artery without angina pectoris: Secondary | ICD-10-CM

## 2013-11-13 DIAGNOSIS — R7402 Elevation of levels of lactic acid dehydrogenase (LDH): Secondary | ICD-10-CM

## 2013-11-13 DIAGNOSIS — IMO0001 Reserved for inherently not codable concepts without codable children: Secondary | ICD-10-CM

## 2013-11-13 DIAGNOSIS — R740 Nonspecific elevation of levels of transaminase and lactic acid dehydrogenase [LDH]: Secondary | ICD-10-CM

## 2013-11-13 LAB — BASIC METABOLIC PANEL
BUN: 16 mg/dL (ref 6–23)
CHLORIDE: 101 meq/L (ref 96–112)
CO2: 28 mEq/L (ref 19–32)
Calcium: 8.9 mg/dL (ref 8.4–10.5)
Creatinine, Ser: 0.8 mg/dL (ref 0.4–1.5)
GFR: 100.23 mL/min (ref >60.00)
GLUCOSE: 93 mg/dL (ref 70–99)
Potassium: 4.1 mEq/L (ref 3.5–5.1)
Sodium: 135 mEq/L (ref 135–145)

## 2013-11-13 LAB — HEPATIC FUNCTION PANEL
ALT: 365 U/L — ABNORMAL HIGH (ref 0–53)
AST: 295 U/L — ABNORMAL HIGH (ref 0–37)
Albumin: 3.4 g/dL — ABNORMAL LOW (ref 3.5–5.2)
Alkaline Phosphatase: 42 U/L (ref 39–117)
BILIRUBIN DIRECT: 0.1 mg/dL (ref 0.0–0.3)
Total Bilirubin: 0.8 mg/dL (ref 0.3–1.2)
Total Protein: 6.1 g/dL (ref 6.0–8.3)

## 2013-11-13 LAB — CK: Total CK: 11389 U/L — ABNORMAL HIGH (ref 7–232)

## 2013-11-13 MED ORDER — PREDNISONE 20 MG PO TABS: 20.0000 mg | ORAL_TABLET | Freq: Every day | ORAL | Status: DC

## 2013-11-13 NOTE — Assessment & Plan Note (Addendum)
Well controlled.  Irbesartan was also new medication before rhabdomyolysis started.  Consider switch back to Benicar. BP: 128/70 mmHg

## 2013-11-13 NOTE — Progress Notes (Signed)
Subjective:    Patient ID: Austin Keller, male    DOB: [birth year], 68 y.o.   MRN: [id number]  HPI 68 year old white male with history of hypertension, nonobstructive coronary artery disease and hyperlipidemia for hospital followup. Patient admitted on March 11th secondary to rhabdomyolysis. His elevated CPK was presumed to statin related and possibly related to recent viral infection. Patient experienced severe proximal lower and upper extremity muscle weakness. His initial CPKs were greater than 19,000. He was treated with IV fluids and IV Solu-Medrol. His CPK on discharge was 9284.  Patient also has elevated transaminases. At discharge his AST was 351 and ALT 425. Both blood tests were trending lower.  Since hospital discharge he has been working with physical therapy and occupational therapy. Patient reports his overall strength has improved 10-20%.  He denies any dark-colored urine.  Hypertension - stable.  Review of Systems Weakness,  No abnormally colored urine    Past Medical History  Diagnosis Date  . Hyperlipidemia   . Dupuytren's contracture of hand 08/2011    left  . Speech disorder   . Hypertension     under control; has been on med. x 2-3 yrs.  Marland Kitchen CAD (coronary artery disease) 10/02/11    Moderate LAD disease with calcification. No obstructive CAD. LV function was normal.   . Asthma     as a child    History   Social History  . Marital Status: Married    Spouse Name: N/A    Number of Children: 3  . Years of Education: N/A   Occupational History  . 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 Smoking status: Former Smoker    Types: Cigarettes    Quit date: 12/06/1997  . Smokeless tobacco: Never Used     Comment: quit smoking 12-15 yrs. ago  . Alcohol Use: 7.0 oz/week    14 drink(s) per week     Comment: 2 drinks/day  . Drug Use: No  . Sexual Activity: Not on file   Other Topics Concern  . Not on file   Social History  Narrative   Wife is 7 yrs younger- works in [employer]    Past Surgical History  Procedure Laterality Date  . Tonsillectomy      AS CHILD  . Hand surgery      left  . Colonoscopy    . Cardiac catheterization  10/02/2011    Moderate LAD disease with calcification. No obstructive CAD. Normal LV function.  . Dupuytren contracture release  11/10/2011    Procedure: DUPUYTREN CONTRACTURE RELEASE;  Surgeon: Cammie Sickle., MD;  Location: [place];  Service: Orthopedics;  Laterality: Left;  palm, long, ring, small, thumb and index web left hand     Family History  Problem Relation Age of Onset  . Hip fracture Mother   . Ulcers Father     gastric    No Known Allergies  Current Outpatient Prescriptions on File Prior to Visit  Medication Sig Dispense Refill  . amLODipine (NORVASC) 5 MG tablet Take 5 mg by mouth at bedtime.       Marland Kitchen aspirin 81 MG tablet Take 81 mg by mouth daily.      . Cyanocobalamin (VITAMIN B 12 PO) Take 1 tablet by mouth at bedtime.       . furosemide (LASIX) 40 MG tablet Take 1 tablet (40 mg total) by mouth daily. For 7 days (  swelling)  7 tablet  0  . irbesartan (AVAPRO) 150 MG tablet Take 1 tablet (150 mg total) by mouth daily.  90 tablet  1  . nitroGLYCERIN (NITROSTAT) 0.4 MG SL tablet Place 1 tablet (0.4 mg total) under the tongue every 5 (five) minutes as needed for chest pain.  25 tablet  3  . potassium chloride 20 MEQ TBCR Take 40 mEq by mouth daily.  14 tablet  0   No current facility-administered medications on file prior to visit.    BP 128/70  Pulse 72  Temp(Src) 98 F (36.7 C) (Oral)  Ht 6\' 3"  (1.905 m)  Wt 248 lb (112.492 kg)  BMI 31.00 kg/m2    Objective:   Physical Exam  Constitutional: He is oriented to person, place, and time. He appears well-developed and well-nourished. No distress.  HENT:  Head: Normocephalic and atraumatic.  Cardiovascular: Normal rate, regular rhythm and normal heart sounds.   No murmur  heard. Pulmonary/Chest: Effort normal and breath sounds normal. He has no wheezes.  Musculoskeletal:  Trace lower extremity edema Upper ext proximal muscle strength 3 out of 5 Lower ext hip flexors muscle strength 2-3 out of 5 Ambulates with cane.  Neurological: He is alert and oriented to person, place, and time. No cranial nerve deficit.  Skin: Skin is warm and dry.  Psychiatric: He has a normal mood and affect. His behavior is normal.          Assessment & Plan:

## 2013-11-13 NOTE — Patient Instructions (Signed)
Avoid alcoholic beverages Avoid taking OTC analgesics Contact our office if your fasting blood sugar is greater than 200

## 2013-11-13 NOTE — Assessment & Plan Note (Signed)
Rhabdomyolysis improving. Last CPK on 11/04/2013 was 9284. Continue to monitor CPK. I discussed pros and cons of prednisone therapy.  He agrees to trial of prednisone 20 mg once daily for 10 days that 10 mg for 4 days.  He was given glucometer in case she develops hyperglycemia.  Nursing staff provided instruction on how to use glucometer.

## 2013-11-13 NOTE — Progress Notes (Signed)
Pre visit review using our clinic review tool, if applicable. No additional management support is needed unless otherwise documented below in the visit note. 

## 2013-11-14 ENCOUNTER — Telehealth: Payer: Self-pay | Admitting: Internal Medicine

## 2013-11-14 MED ORDER — OLMESARTAN MEDOXOMIL 20 MG PO TABS: 20.0000 mg | ORAL_TABLET | Freq: Every day | ORAL | Status: DC

## 2013-11-14 NOTE — Telephone Encounter (Signed)
Relevant patient education mailed to patient.  

## 2013-11-14 NOTE — Addendum Note (Signed)
Addended by: Rosine Abe on: 11/14/2013 09:18 AM   Modules accepted: Orders, Medications

## 2013-11-15 ENCOUNTER — Other Ambulatory Visit: Payer: Self-pay | Admitting: *Deleted

## 2013-11-15 MED ORDER — GLUCOSE BLOOD VI STRP: 1.0000 | ORAL_STRIP | Freq: Every day | Status: DC

## 2013-11-15 MED ORDER — LANCETS THIN MISC: 1.0000 | Freq: Every day | Status: DC

## 2013-11-17 ENCOUNTER — Ambulatory Visit: Payer: Medicare Other | Admitting: Internal Medicine

## 2013-11-17 DIAGNOSIS — M6282 Rhabdomyolysis: Secondary | ICD-10-CM

## 2013-11-17 DIAGNOSIS — I10 Essential (primary) hypertension: Secondary | ICD-10-CM

## 2013-11-17 DIAGNOSIS — IMO0001 Reserved for inherently not codable concepts without codable children: Secondary | ICD-10-CM

## 2013-11-17 DIAGNOSIS — I2510 Atherosclerotic heart disease of native coronary artery without angina pectoris: Secondary | ICD-10-CM

## 2013-11-22 ENCOUNTER — Telehealth: Payer: Self-pay | Admitting: Internal Medicine

## 2013-11-22 NOTE — Telephone Encounter (Signed)
Ok per Dr Shawna Orleans, verbal orders given

## 2013-11-22 NOTE — Telephone Encounter (Signed)
He is at the end of his orders and pt is still in home status.  Austin Keller would like an order to continue care 1 time per week for 4 weeks. Austin Keller would like a callback.

## 2013-11-23 ENCOUNTER — Other Ambulatory Visit: Payer: Self-pay

## 2013-11-23 MED ORDER — LANCETS MISC: Status: DC

## 2013-11-23 MED ORDER — GLUCOSE BLOOD VI STRP: 1.0000 | ORAL_STRIP | Freq: Every day | Status: DC

## 2013-11-23 NOTE — Telephone Encounter (Signed)
rx faxed in for accu-chek avia soft clix lancets

## 2013-11-27 ENCOUNTER — Other Ambulatory Visit: Payer: Self-pay | Admitting: Internal Medicine

## 2013-11-28 ENCOUNTER — Telehealth: Payer: Self-pay | Admitting: Internal Medicine

## 2013-11-28 ENCOUNTER — Telehealth: Payer: Self-pay

## 2013-11-28 LAB — BASIC METABOLIC PANEL
BUN: 16 mg/dL (ref 6–23)
CHLORIDE: 102 meq/L (ref 96–112)
CO2: 31 mEq/L (ref 19–32)
Calcium: 9 mg/dL (ref 8.4–10.5)
Creat: 0.59 mg/dL (ref 0.50–1.35)
GLUCOSE: 89 mg/dL (ref 70–99)
Potassium: 4.4 mEq/L (ref 3.5–5.3)
Sodium: 141 mEq/L (ref 135–145)

## 2013-11-28 LAB — HEPATIC FUNCTION PANEL
ALBUMIN: 3.4 g/dL — AB (ref 3.5–5.2)
ALT: 392 U/L — AB (ref 0–53)
AST: 319 U/L — AB (ref 0–37)
Alkaline Phosphatase: 36 U/L — ABNORMAL LOW (ref 39–117)
BILIRUBIN DIRECT: 0.1 mg/dL (ref 0.0–0.3)
Indirect Bilirubin: 0.6 mg/dL (ref 0.2–1.2)
Total Bilirubin: 0.7 mg/dL (ref 0.2–1.2)
Total Protein: 6 g/dL (ref 6.0–8.3)

## 2013-11-28 LAB — CK: CK TOTAL: 11824 U/L — AB (ref 7–232)

## 2013-11-28 NOTE — Telephone Encounter (Signed)
PT called to report and states pt had a fall in the home after trying to transferring to commode. Vitals within normal limitis.  Pt reported on prednisone and started on 1/2 Friday and reports more weakness since the dose change.

## 2013-11-28 NOTE — Telephone Encounter (Signed)
[facility] is calling needing a order for a bed side commode for the pt. Please fax to [phone]

## 2013-11-29 ENCOUNTER — Telehealth: Payer: Self-pay | Admitting: Internal Medicine

## 2013-11-29 NOTE — Telephone Encounter (Signed)
Ok per Dr Shawna Orleans, order faxed

## 2013-11-29 NOTE — Telephone Encounter (Signed)
Merry Proud w/ [facility] would like you to call w/ verbal order to get pt a potty chair. Merry Proud is going to fax paperwork .but would like to expedite this order so they can get pt his chair. pls call

## 2013-11-29 NOTE — Telephone Encounter (Signed)
I faxed an order over this morning.  Called Merry Proud back and gave verbal order and informed him of the fax

## 2013-11-30 ENCOUNTER — Other Ambulatory Visit: Payer: Self-pay | Admitting: Internal Medicine

## 2013-11-30 ENCOUNTER — Telehealth: Payer: Self-pay | Admitting: Internal Medicine

## 2013-11-30 DIAGNOSIS — R748 Abnormal levels of other serum enzymes: Secondary | ICD-10-CM

## 2013-11-30 NOTE — Telephone Encounter (Signed)
Pt would like to be called about blood work that was done on [facility] 11/27/13

## 2013-11-30 NOTE — Telephone Encounter (Signed)
See result note.  

## 2013-12-04 ENCOUNTER — Ambulatory Visit: Payer: Medicare Other | Admitting: Nurse Practitioner

## 2013-12-04 ENCOUNTER — Telehealth: Payer: Self-pay | Admitting: Internal Medicine

## 2013-12-04 NOTE — Telephone Encounter (Signed)
Dr. Ouida Sills is rheumatologist.  He is a specialist in joint and muscle issues.  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  It is important pt proceed with referral as directed.

## 2013-12-04 NOTE — Telephone Encounter (Signed)
Pt states he is unclear of the reason Dr. Shawna Orleans has referred him to Dr. Tobie Keller and what is it that Dr. Ouida Sills can do that Dr. Shawna Orleans can not.

## 2013-12-05 NOTE — Telephone Encounter (Signed)
Pt aware, he will call them back and schedule an appt

## 2013-12-06 ENCOUNTER — Other Ambulatory Visit: Payer: Self-pay | Admitting: Internal Medicine

## 2013-12-06 ENCOUNTER — Telehealth: Payer: Self-pay | Admitting: Internal Medicine

## 2013-12-06 NOTE — Telephone Encounter (Signed)
Patient advised to hold off on PT for now.  Patient has appt with Dr. Ouida Sills on Monday 4/20.  Pt also advised to start vit D3 6000 units daily and CoQ10 once daily

## 2013-12-06 NOTE — Telephone Encounter (Signed)
Pt would like md to call him back today. Pt would not elaborate

## 2013-12-06 NOTE — Telephone Encounter (Signed)
Pt Physical therapy Austin Keller called  to say that he was concern about increased in weakness,  in the 2 weeks pt no longer able to stand up from a standing  chair, pt has increased anxiety and fustration, pt had a fall on 11/28/13 with no injuries

## 2013-12-11 ENCOUNTER — Telehealth: Payer: Self-pay | Admitting: Internal Medicine

## 2013-12-11 NOTE — Telephone Encounter (Signed)
Pt states his appt with Dr. Tobie Lords has been rescheduled multiple times with his office due to the physician being out of the office or leaving the office early.  Pt would like to know if Dr. Shawna Orleans has another rheumatologist he recommends that is not in the practice with Dr. Ouida Sills and that is fairly close to [facility].

## 2013-12-11 NOTE — Telephone Encounter (Signed)
See if Dr. Charlestine Night can see patient.

## 2013-12-12 ENCOUNTER — Telehealth: Payer: Self-pay | Admitting: Internal Medicine

## 2013-12-12 NOTE — Telephone Encounter (Signed)
Already taken care of

## 2013-12-12 NOTE — Telephone Encounter (Signed)
Austin Keller has faxed records to Dr. Elmon Else office, awaiting appt.  Pt aware.

## 2013-12-12 NOTE — Telephone Encounter (Signed)
Fax was sent last week and telephone call reporting pt has declined in strength.  Pt concerned elevated blood levels showing muscle loss.

## 2013-12-13 ENCOUNTER — Telehealth: Payer: Self-pay | Admitting: Internal Medicine

## 2013-12-13 NOTE — Telephone Encounter (Signed)
FYI

## 2013-12-13 NOTE — Telephone Encounter (Signed)
Stop taking amlodipine.  Monitor BP.  Call next week.

## 2013-12-13 NOTE — Telephone Encounter (Signed)
FYI:  BP 120/60 seated and standing 80/60 then 80/50.  No dizziness.  Pt continues to report reduced strength including need of assistance to get up from bedside commode at the highest setting. I advised therapist and pt of the referral with the contact number. Pt is getting depressed due to symptoms and the wait for an appointment.

## 2013-12-13 NOTE — Telephone Encounter (Signed)
Pt had a missed visit this week/.

## 2013-12-15 ENCOUNTER — Encounter: Payer: Medicare Other | Attending: Physical Medicine & Rehabilitation

## 2013-12-15 ENCOUNTER — Encounter: Payer: Self-pay | Admitting: Physical Medicine & Rehabilitation

## 2013-12-15 ENCOUNTER — Ambulatory Visit: Payer: Medicare Other | Admitting: Physical Medicine & Rehabilitation

## 2013-12-15 VITALS — BP 131/76 | HR 90 | Resp 14 | Ht 75.0 in | Wt 240.2 lb

## 2013-12-15 DIAGNOSIS — M6289 Other specified disorders of muscle: Secondary | ICD-10-CM

## 2013-12-15 DIAGNOSIS — R29898 Other symptoms and signs involving the musculoskeletal system: Secondary | ICD-10-CM | POA: Insufficient documentation

## 2013-12-15 NOTE — Patient Instructions (Signed)
EMG report will be going to Dr. Charlestine Night

## 2013-12-15 NOTE — Telephone Encounter (Signed)
Left message for pt to call back  °

## 2013-12-15 NOTE — Progress Notes (Signed)
EMG performed 12/15/2013.  See EMG report under media tab.

## 2013-12-19 ENCOUNTER — Telehealth (INDEPENDENT_AMBULATORY_CARE_PROVIDER_SITE_OTHER): Payer: Self-pay

## 2013-12-19 NOTE — Telephone Encounter (Signed)
Pt aware, appt scheduled for next week Wednesday.  He will bring bp readings in with him

## 2013-12-19 NOTE — Telephone Encounter (Signed)
Mary from Dr. Elmon Else office calling back.  Dr. Charlestine Night would like this bx done ASAP - 12/25/13 would be fine.  Please contact Dr. Lucia Gaskins to set this up if possible.

## 2013-12-19 NOTE — Telephone Encounter (Signed)
Called Dr. Elmon Else office to see how soon patient need's to have muscle biopsy.  Spoke with front office, they will call back on our Dr line to give me more details on urgency of biopsy.  If this need's to be within a week I will review with Dr. Lucia Gaskins which is DOW next week to see if we can add on the [place] room.  If the biopsy can wait 1-2 weeks I will check with Dr. Rosendo Gros to see if we can add to his OR time on 01/01/14.

## 2013-12-19 NOTE — Telephone Encounter (Signed)
Called and spoke to patient regarding scheduling office appointment for Evaluation for Muscle Biopsy at the request of Dr. Charlestine Night.  Patient states he cannot drive and he would not be able to come to our office today but can come tomorrow.  Patient has been scheduled to see Dr. Rosendo Gros on Wed 12/21/13 @ 4:30pm.  I discussed the muscle biopsy surgery briefly with the patient, which he states he can only have the surgery on Mondays due to transportation and someone being with him before and after the procedure.  Patient given address and directions to our facility.

## 2013-12-19 NOTE — Telephone Encounter (Signed)
Pt aware.

## 2013-12-20 ENCOUNTER — Encounter (INDEPENDENT_AMBULATORY_CARE_PROVIDER_SITE_OTHER): Payer: Self-pay

## 2013-12-20 ENCOUNTER — Ambulatory Visit (INDEPENDENT_AMBULATORY_CARE_PROVIDER_SITE_OTHER): Payer: Medicare Other | Admitting: General Surgery

## 2013-12-20 ENCOUNTER — Ambulatory Visit: Payer: Medicare Other | Admitting: Internal Medicine

## 2013-12-20 ENCOUNTER — Encounter: Payer: Self-pay | Admitting: Cardiology

## 2013-12-20 ENCOUNTER — Encounter (INDEPENDENT_AMBULATORY_CARE_PROVIDER_SITE_OTHER): Payer: Self-pay | Admitting: General Surgery

## 2013-12-20 VITALS — BP 148/88 | HR 71 | Temp 97.3°F | Resp 16 | Ht 75.0 in | Wt 242.4 lb

## 2013-12-20 DIAGNOSIS — M6281 Muscle weakness (generalized): Secondary | ICD-10-CM

## 2013-12-20 NOTE — Progress Notes (Signed)
Patient ID: Austin Keller, male   DOB: 20-Aug-1946, 68 y.o.   MRN: [id number]  No chief complaint on file.   HPI Austin Keller is a 68 y.o. male.  The patient is a 68 year old male who is referred by Dr. Charlestine Night for evaluation of muscle weakness and the need for muscle biopsy. Patient states that from January of this year he began having weakness. He said this progressively got worse.  He was initially treated by DC his Lipitor as well as steroids. His condition  Did not improve. Patient recently underwent an EMG, with results pending.  HPI  Past Medical History  Diagnosis Date  . Hyperlipidemia   . Dupuytren's contracture of hand 08/2011    left  . Speech disorder   . Hypertension     under control; has been on med. x 2-3 yrs.  Marland Kitchen CAD (coronary artery disease) 10/02/11    Moderate LAD disease with calcification. No obstructive CAD. LV function was normal.   . Asthma     as a child    Past Surgical History  Procedure Laterality Date  . Tonsillectomy      AS CHILD  . Hand surgery      left  . Colonoscopy    . Cardiac catheterization  10/02/2011    Moderate LAD disease with calcification. No obstructive CAD. Normal LV function.  . Dupuytren contracture release  11/10/2011    Procedure: DUPUYTREN CONTRACTURE RELEASE;  Surgeon: Cammie Sickle., MD;  Location: [facility];  Service: Orthopedics;  Laterality: Left;  palm, long, ring, small, thumb and index web left hand     Family History  Problem Relation Age of Onset  . Hip fracture Mother   . Ulcers Father     gastric    Social History History  Substance Use Topics  . Smoking status: Former Smoker    Types: Cigarettes    Quit date: 12/06/1997  . Smokeless tobacco: Never Used     Comment: quit smoking 12-15 yrs. ago  . Alcohol Use: 7.0 oz/week    14 drink(s) per week     Comment: 2 drinks/day    No Known Allergies  Current Outpatient Prescriptions  Medication Sig Dispense Refill  . alendronate  (FOSAMAX) 70 MG tablet       . amLODipine (NORVASC) 5 MG tablet Take 5 mg by mouth at bedtime.       Marland Kitchen aspirin 81 MG tablet Take 81 mg by mouth daily.      . Cyanocobalamin (VITAMIN B 12 PO) Take 1 tablet by mouth at bedtime.       . furosemide (LASIX) 40 MG tablet Take 1 tablet (40 mg total) by mouth daily. For 7 days (swelling)  7 tablet  0  . glucose blood (ACCU-CHEK AVIVA) test strip 1 each by Other route daily. Use as instructed  100 each  5  . irbesartan (AVAPRO) 150 MG tablet       . Lancets MISC accu -chek aviva softclix lancets Test glucose one time daily Dx code: 790.29  100 each  5  . nitroGLYCERIN (NITROSTAT) 0.4 MG SL tablet Place 1 tablet (0.4 mg total) under the tongue every 5 (five) minutes as needed for chest pain.  25 tablet  3  . olmesartan (BENICAR) 20 MG tablet Take 0.5 tablets (10 mg total) by mouth daily.  90 tablet  1  . potassium chloride 20 MEQ TBCR Take 40 mEq by mouth daily.  14 tablet  0  . predniSONE (DELTASONE) 20 MG tablet        No current facility-administered medications for this visit.    Review of Systems Review of Systems  HENT: Negative.   Eyes: Negative.   Respiratory: Negative.   Cardiovascular: Negative.   Gastrointestinal: Negative.   Endocrine: Negative.   Neurological: Positive for weakness.    Blood pressure 148/88, pulse 71, temperature 97.3 F (36.3 C), temperature source Temporal, resp. rate 16, height 6\' 3"  (1.905 m), weight 242 lb 6.4 oz (109.952 kg).  Physical Exam Physical Exam  Constitutional: He is oriented to person, place, and time. He appears well-developed and well-nourished.  HENT:  Head: Normocephalic and atraumatic.  Eyes: Conjunctivae and EOM are normal. Pupils are equal, round, and reactive to light.  Neck: Normal range of motion. Neck supple.  Cardiovascular: Normal rate, regular rhythm and normal heart sounds.   Pulmonary/Chest: Effort normal and breath sounds normal.  Neurological: He is alert and oriented to  person, place, and time. He exhibits abnormal muscle tone (decreased quadricep and deltoid muscle strength.).  Skin: Skin is warm and dry.    Data Reviewed As above  Assessment    68 year old male with muscle weakness and the need for muscle biopsy.     Plan    1. We'll proceed to the operating room for a right lower extremity muscle biopsy. 2. Discussed with patient the risks and benefits of the procedure to include but not limited to: Infection, bleeding, damage to surrounding structures, any other unforeseen complications. The patient voiced understanding and wishes to proceed.        Ralene Ok 12/20/2013, 4:31 PM

## 2013-12-21 ENCOUNTER — Encounter (HOSPITAL_COMMUNITY)
Admission: RE | Admit: 2013-12-21 | Discharge: 2013-12-21 | Disposition: A | Discharge status: Home / Self Care | Payer: Medicare Other | Source: Ambulatory Visit | Attending: General Surgery | Admitting: General Surgery

## 2013-12-21 ENCOUNTER — Other Ambulatory Visit (INDEPENDENT_AMBULATORY_CARE_PROVIDER_SITE_OTHER): Payer: Self-pay | Admitting: General Surgery

## 2013-12-21 ENCOUNTER — Other Ambulatory Visit (HOSPITAL_COMMUNITY): Payer: Self-pay | Admitting: *Deleted

## 2013-12-21 ENCOUNTER — Encounter (HOSPITAL_COMMUNITY): Payer: Self-pay

## 2013-12-21 HISTORY — DX: Shortness of breath: R06.02

## 2013-12-21 HISTORY — DX: Rhabdomyolysis: M62.82

## 2013-12-21 LAB — BASIC METABOLIC PANEL
BUN: 19 mg/dL (ref 6–23)
CO2: 29 mEq/L (ref 19–32)
CREATININE: 0.61 mg/dL (ref 0.50–1.35)
Calcium: 8.8 mg/dL (ref 8.4–10.5)
Chloride: 104 mEq/L (ref 96–112)
GFR calc Af Amer: >90 mL/min (ref >90)
GFR calc non Af Amer: >90 mL/min (ref >90)
Glucose, Bld: 88 mg/dL (ref 70–99)
Potassium: 3.6 mEq/L — ABNORMAL LOW (ref 3.7–5.3)
Sodium: 144 mEq/L (ref 137–147)

## 2013-12-21 LAB — CBC
HCT: 38.2 % — ABNORMAL LOW (ref 39.0–52.0)
Hemoglobin: 12.8 g/dL — ABNORMAL LOW (ref 13.0–17.0)
MCH: 32.9 pg (ref 26.0–34.0)
MCHC: 33.5 g/dL (ref 30.0–36.0)
MCV: 98.2 fL (ref 78.0–100.0)
Platelets: 305 10*3/uL (ref 150–400)
RBC: 3.89 MIL/uL — ABNORMAL LOW (ref 4.22–5.81)
RDW: 14.8 % (ref 11.5–15.5)
WBC: 12.9 10*3/uL — ABNORMAL HIGH (ref 4.0–10.5)

## 2013-12-21 MED ORDER — CHLORHEXIDINE GLUCONATE 4 % EX LIQD
1.0000 "application " | Freq: Once | CUTANEOUS | Status: DC
  Filled 2013-12-21: qty 15

## 2013-12-21 MED ORDER — CEFAZOLIN SODIUM-DEXTROSE 2-3 GM-% IV SOLR
2.0000 g | INTRAVENOUS | Status: AC
Start: 2013-12-22 — End: 2013-12-22
  Administered 2013-12-22: 2 g via INTRAVENOUS
  Filled 2013-12-21: qty 50

## 2013-12-21 NOTE — Progress Notes (Signed)
12/21/13 1138  [place]  Have you ever been diagnosed with sleep apnea through a sleep study? No  Do you snore loudly (loud enough to be heard through closed doors)?  0  Do you often feel tired, fatigued, or sleepy during the daytime? 0  Has anyone observed you stop breathing during your sleep? 0  Do you have, or are you being treated for high blood pressure? 1  BMI more than 35 kg/m2? 0  Age over 70 years old? 1  Neck circumference greater than 40 cm/16 inches? 1 (16.5)  Gender: 1  Obstructive Sleep Apnea Score 4  Score 4 or greater  Results sent to PCP

## 2013-12-21 NOTE — Pre-Procedure Instructions (Signed)
Austin Keller  12/21/2013   Your procedure is scheduled on: Friday, May 1.  Report to [facility] Admitting at 5:30AM.  Call this number if you have problems the morning of surgery: [phone]   Remember:   Do not eat food or drink liquids after midnight.   Take these medicines the morning of surgery with A SIP OF WATER: Take if needed:nitroGLYCERIN (NITROSTAT).    Do not wear jewelry, make-up or nail polish.  Do not wear lotions, powders, or perfumes.   Men may shave face and neck.  Do not bring valuables to the hospital.              [facility] is not responsible for any belongings or valuables.               Contacts, dentures or bridgework may not be worn into surgery.  Leave suitcase in the car. After surgery it may be brought to your room.  For patients admitted to the hospital, discharge time is determined by your treatment team.               Patients discharged the day of surgery will not be allowed to drive home.  Name and phone number of your driver: -   Special Instructions: Review  [facility] - Preparing For Surgery.   Please read over the following fact sheets that you were given: Pain Booklet, Coughing and Deep Breathing and Surgical Site Infection Prevention

## 2013-12-22 ENCOUNTER — Encounter (HOSPITAL_COMMUNITY): Payer: Medicare Other | Admitting: Anesthesiology

## 2013-12-22 ENCOUNTER — Encounter (HOSPITAL_COMMUNITY)
Admission: RE | Disposition: A | Discharge status: Home / Self Care | Payer: Self-pay | Source: Ambulatory Visit | Attending: General Surgery

## 2013-12-22 ENCOUNTER — Encounter (HOSPITAL_COMMUNITY): Payer: Self-pay | Admitting: *Deleted

## 2013-12-22 ENCOUNTER — Ambulatory Visit (HOSPITAL_COMMUNITY): Payer: Medicare Other | Admitting: Anesthesiology

## 2013-12-22 ENCOUNTER — Ambulatory Visit (HOSPITAL_COMMUNITY)
Admission: RE | Admit: 2013-12-22 | Discharge: 2013-12-22 | Disposition: A | Discharge status: Home / Self Care | Payer: Medicare Other | Source: Ambulatory Visit | Attending: General Surgery | Admitting: General Surgery

## 2013-12-22 DIAGNOSIS — M6281 Muscle weakness (generalized): Secondary | ICD-10-CM | POA: Insufficient documentation

## 2013-12-22 DIAGNOSIS — E785 Hyperlipidemia, unspecified: Secondary | ICD-10-CM | POA: Insufficient documentation

## 2013-12-22 DIAGNOSIS — Z79899 Other long term (current) drug therapy: Secondary | ICD-10-CM | POA: Insufficient documentation

## 2013-12-22 DIAGNOSIS — I2510 Atherosclerotic heart disease of native coronary artery without angina pectoris: Secondary | ICD-10-CM | POA: Insufficient documentation

## 2013-12-22 DIAGNOSIS — M6250 Muscle wasting and atrophy, not elsewhere classified, unspecified site: Secondary | ICD-10-CM | POA: Insufficient documentation

## 2013-12-22 DIAGNOSIS — Z87891 Personal history of nicotine dependence: Secondary | ICD-10-CM | POA: Insufficient documentation

## 2013-12-22 DIAGNOSIS — I10 Essential (primary) hypertension: Secondary | ICD-10-CM | POA: Insufficient documentation

## 2013-12-22 HISTORY — PX: MUSCLE BIOPSY: SHX716

## 2013-12-22 SURGERY — MUSCLE BIOPSY
Anesthesia: General | Site: Thigh | Laterality: Right

## 2013-12-22 MED ORDER — ROCURONIUM BROMIDE 50 MG/5ML IV SOLN
INTRAVENOUS | Status: AC
  Filled 2013-12-22: qty 1

## 2013-12-22 MED ORDER — PROMETHAZINE HCL 25 MG/ML IJ SOLN: 6.2500 mg | INTRAMUSCULAR | Status: DC | PRN

## 2013-12-22 MED ORDER — 0.9 % SODIUM CHLORIDE (POUR BTL) OPTIME
TOPICAL | Status: DC | PRN
  Administered 2013-12-22: 1000 mL

## 2013-12-22 MED ORDER — LIDOCAINE HCL (PF) 1 % IJ SOLN
INTRAMUSCULAR | Status: AC
  Filled 2013-12-22: qty 30

## 2013-12-22 MED ORDER — LIDOCAINE HCL (CARDIAC) 20 MG/ML IV SOLN
INTRAVENOUS | Status: DC | PRN
  Administered 2013-12-22: 50 mg via INTRAVENOUS

## 2013-12-22 MED ORDER — ONDANSETRON HCL 4 MG/2ML IJ SOLN
INTRAMUSCULAR | Status: DC | PRN
  Administered 2013-12-22: 4 mg via INTRAVENOUS

## 2013-12-22 MED ORDER — ONDANSETRON HCL 4 MG/2ML IJ SOLN
INTRAMUSCULAR | Status: AC
  Filled 2013-12-22: qty 2

## 2013-12-22 MED ORDER — HYDROMORPHONE HCL PF 1 MG/ML IJ SOLN: 0.2500 mg | INTRAMUSCULAR | Status: DC | PRN

## 2013-12-22 MED ORDER — HYDROCORTISONE NA SUCCINATE PF 100 MG IJ SOLR
INTRAMUSCULAR | Status: DC | PRN
  Administered 2013-12-22: 100 mg via INTRAVENOUS

## 2013-12-22 MED ORDER — FENTANYL CITRATE 0.05 MG/ML IJ SOLN
INTRAMUSCULAR | Status: DC | PRN
  Administered 2013-12-22 (×2): 50 ug via INTRAVENOUS

## 2013-12-22 MED ORDER — OXYCODONE HCL 5 MG/5ML PO SOLN: 5.0000 mg | Freq: Once | ORAL | Status: DC | PRN

## 2013-12-22 MED ORDER — OXYCODONE-ACETAMINOPHEN 5-325 MG PO TABS: 1.0000 | ORAL_TABLET | ORAL | Status: DC | PRN

## 2013-12-22 MED ORDER — MIDAZOLAM HCL 5 MG/5ML IJ SOLN
INTRAMUSCULAR | Status: DC | PRN
  Administered 2013-12-22: 2 mg via INTRAVENOUS

## 2013-12-22 MED ORDER — OXYCODONE-ACETAMINOPHEN 5-325 MG PO TABS
ORAL_TABLET | ORAL | Status: AC
  Administered 2013-12-22: 2 via ORAL
  Filled 2013-12-22: qty 2

## 2013-12-22 MED ORDER — OXYCODONE HCL 5 MG PO TABS: 5.0000 mg | ORAL_TABLET | Freq: Once | ORAL | Status: DC | PRN

## 2013-12-22 MED ORDER — LIDOCAINE HCL (CARDIAC) 20 MG/ML IV SOLN
INTRAVENOUS | Status: AC
  Filled 2013-12-22: qty 5

## 2013-12-22 MED ORDER — BUPIVACAINE HCL (PF) 0.25 % IJ SOLN
INTRAMUSCULAR | Status: DC | PRN
  Administered 2013-12-22: 10 mL

## 2013-12-22 MED ORDER — EPHEDRINE SULFATE 50 MG/ML IJ SOLN
INTRAMUSCULAR | Status: DC | PRN
  Administered 2013-12-22: 10 mg via INTRAVENOUS
  Administered 2013-12-22: 15 mg via INTRAVENOUS
  Administered 2013-12-22: 10 mg via INTRAVENOUS

## 2013-12-22 MED ORDER — BUPIVACAINE HCL (PF) 0.25 % IJ SOLN
INTRAMUSCULAR | Status: AC
  Filled 2013-12-22: qty 30

## 2013-12-22 MED ORDER — PROPOFOL 10 MG/ML IV BOLUS
INTRAVENOUS | Status: AC
  Filled 2013-12-22: qty 20

## 2013-12-22 MED ORDER — MEPERIDINE HCL 25 MG/ML IJ SOLN: 6.2500 mg | INTRAMUSCULAR | Status: DC | PRN

## 2013-12-22 MED ORDER — LACTATED RINGERS IV SOLN
INTRAVENOUS | Status: DC | PRN
  Administered 2013-12-22: 07:00:00 via INTRAVENOUS

## 2013-12-22 MED ORDER — MIDAZOLAM HCL 2 MG/2ML IJ SOLN
INTRAMUSCULAR | Status: AC
  Filled 2013-12-22: qty 2

## 2013-12-22 MED ORDER — PROPOFOL 10 MG/ML IV BOLUS
INTRAVENOUS | Status: DC | PRN
  Administered 2013-12-22: 200 mg via INTRAVENOUS

## 2013-12-22 MED ORDER — OXYCODONE-ACETAMINOPHEN 5-325 MG PO TABS
1.0000 | ORAL_TABLET | Freq: Once | ORAL | Status: AC
  Administered 2013-12-22: 2 via ORAL

## 2013-12-22 MED ORDER — PHENYLEPHRINE HCL 10 MG/ML IJ SOLN
INTRAMUSCULAR | Status: DC | PRN
  Administered 2013-12-22: 80 ug via INTRAVENOUS

## 2013-12-22 MED ORDER — FENTANYL CITRATE 0.05 MG/ML IJ SOLN
INTRAMUSCULAR | Status: AC
  Filled 2013-12-22: qty 5

## 2013-12-22 SURGICAL SUPPLY — 38 items
CHLORAPREP W/TINT 10.5 ML (MISCELLANEOUS) ×3 IMPLANT
CONT SPEC 4OZ CLIKSEAL STRL BL (MISCELLANEOUS) ×3 IMPLANT
COVER SURGICAL LIGHT HANDLE (MISCELLANEOUS) ×3 IMPLANT
DERMABOND ADVANCED (GAUZE/BANDAGES/DRESSINGS) ×2
DERMABOND ADVANCED .7 DNX12 (GAUZE/BANDAGES/DRESSINGS) ×1 IMPLANT
DRAPE PED LAPAROTOMY (DRAPES) ×3 IMPLANT
DRAPE UTILITY 15X26 W/TAPE STR (DRAPE) ×6 IMPLANT
DRESSING TELFA 8X3 (GAUZE/BANDAGES/DRESSINGS) ×3 IMPLANT
ELECT CAUTERY BLADE 6.4 (BLADE) ×3 IMPLANT
ELECT REM PT RETURN 9FT ADLT (ELECTROSURGICAL) ×3
ELECTRODE REM PT RTRN 9FT ADLT (ELECTROSURGICAL) ×1 IMPLANT
GAUZE SPONGE 4X4 16PLY XRAY LF (GAUZE/BANDAGES/DRESSINGS) ×3 IMPLANT
GLOVE BIO SURGEON STRL SZ7.5 (GLOVE) ×3 IMPLANT
GLOVE BIOGEL PI IND STRL 7.0 (GLOVE) ×1 IMPLANT
GLOVE BIOGEL PI IND STRL 8 (GLOVE) ×1 IMPLANT
GLOVE BIOGEL PI INDICATOR 7.0 (GLOVE) ×2
GLOVE BIOGEL PI INDICATOR 8 (GLOVE) ×2
GLOVE SURG SS PI 7.0 STRL IVOR (GLOVE) ×3 IMPLANT
GOWN STRL REUS W/ TWL LRG LVL3 (GOWN DISPOSABLE) ×1 IMPLANT
GOWN STRL REUS W/ TWL XL LVL3 (GOWN DISPOSABLE) ×1 IMPLANT
GOWN STRL REUS W/TWL LRG LVL3 (GOWN DISPOSABLE) ×2
GOWN STRL REUS W/TWL XL LVL3 (GOWN DISPOSABLE) ×2
KIT BASIN OR (CUSTOM PROCEDURE TRAY) ×3 IMPLANT
KIT ROOM TURNOVER OR (KITS) ×3 IMPLANT
NEEDLE HYPO 25GX1X1/2 BEV (NEEDLE) ×3 IMPLANT
NS IRRIG 1000ML POUR BTL (IV SOLUTION) ×3 IMPLANT
PACK SURGICAL SETUP 50X90 (CUSTOM PROCEDURE TRAY) ×3 IMPLANT
PAD ARMBOARD 7.5X6 YLW CONV (MISCELLANEOUS) ×3 IMPLANT
PENCIL BUTTON HOLSTER BLD 10FT (ELECTRODE) ×3 IMPLANT
SUT MNCRL AB 4-0 PS2 18 (SUTURE) ×3 IMPLANT
SUT SILK 2 0 (SUTURE) ×2
SUT SILK 2-0 18XBRD TIE 12 (SUTURE) ×1 IMPLANT
SUT VIC AB 3-0 SH 27 (SUTURE) ×4
SUT VIC AB 3-0 SH 27X BRD (SUTURE) ×2 IMPLANT
SYR BULB 3OZ (MISCELLANEOUS) ×3 IMPLANT
SYR CONTROL 10ML LL (SYRINGE) ×3 IMPLANT
TOWEL OR 17X24 6PK STRL BLUE (TOWEL DISPOSABLE) ×3 IMPLANT
TOWEL OR 17X26 10 PK STRL BLUE (TOWEL DISPOSABLE) ×3 IMPLANT

## 2013-12-22 NOTE — Transfer of Care (Signed)
Immediate Anesthesia Transfer of Care Note  Patient: Austin Keller  Procedure(s) Performed: Procedure(s): MUSCLE BIOPSY RIGHT LOWER EXTREMITY  (Right)  Patient Location: PACU  Anesthesia Type:General  Level of Consciousness: awake, alert , oriented and patient cooperative  Airway & Oxygen Therapy: Patient Spontanous Breathing and Patient connected to nasal cannula oxygen  Post-op Assessment: Report given to PACU RN and Post -op Vital signs reviewed and stable  Post vital signs: Reviewed and stable  Complications: No apparent anesthesia complications

## 2013-12-22 NOTE — Discharge Instructions (Signed)
Muscle Biopsy Care After Refer to this sheet in the next few weeks. These instructions provide you with information on caring for yourself after your procedure. Your caregiver may also give you more specific instructions. Your treatment has been planned according to current medical practices, but problems sometimes occur. Call your caregiver if you have any problems or questions after your procedure.  HOME CARE INSTRUCTIONS   Only take over-the-counter or prescription medicines as directed by your caregiver.   Limit activities or movements as directed by your caregiver.   Take showers. Do not take baths until your caregiver approves.   Remove or change any bandages (dressings) only as directed by your caregiver. You may have skin adhesive strips over the biopsy site. Do not take the strips off. They will fall off on their own.  Keep all follow-up appointments with your caregiver. Ask when your test results will be ready. Make sure you get your test results.  SEEK MEDICAL CARE IF:   You have increased bleeding from the biopsy site.  You develop increasing redness, swelling, or pain in the area of the biopsy.   You have yellowish white fluid (pus) coming from the biopsy site.  You have a fever.  You notice a bad smell coming from the biopsy site or dressing.   You are light-headed or feel faint.  SEEK IMMEDIATE MEDICAL CARE IF:   You develop a rash.  You have difficulty breathing.  You have any reaction or side effects to medicines given. MAKE SURE YOU:  Understand these instructions.  Will watch your condition.  Will get help right away if you are not doing well or get worse. Document Released: 02/27/2005 Document Revised: 07/27/2012 Document Reviewed: 06/22/2012 [facility] Patient Information 2014 [place].

## 2013-12-22 NOTE — H&P (View-Only) (Signed)
Patient ID: Austin Keller, male   DOB: 20-Aug-1946, 68 y.o.   MRN: [id number]  No chief complaint on file.   HPI Austin Keller is a 68 y.o. male.  The patient is a 68 year old male who is referred by Dr. Charlestine Night for evaluation of muscle weakness and the need for muscle biopsy. Patient states that from January of this year he began having weakness. He said this progressively got worse.  He was initially treated by DC his Lipitor as well as steroids. His condition  Did not improve. Patient recently underwent an EMG, with results pending.  HPI  Past Medical History  Diagnosis Date  . Hyperlipidemia   . Dupuytren's contracture of hand 08/2011    left  . Speech disorder   . Hypertension     under control; has been on med. x 2-3 yrs.  Marland Kitchen CAD (coronary artery disease) 10/02/11    Moderate LAD disease with calcification. No obstructive CAD. LV function was normal.   . Asthma     as a child    Past Surgical History  Procedure Laterality Date  . Tonsillectomy      AS CHILD  . Hand surgery      left  . Colonoscopy    . Cardiac catheterization  10/02/2011    Moderate LAD disease with calcification. No obstructive CAD. Normal LV function.  . Dupuytren contracture release  11/10/2011    Procedure: DUPUYTREN CONTRACTURE RELEASE;  Surgeon: Cammie Sickle., MD;  Location: [facility];  Service: Orthopedics;  Laterality: Left;  palm, long, ring, small, thumb and index web left hand     Family History  Problem Relation Age of Onset  . Hip fracture Mother   . Ulcers Father     gastric    Social History History  Substance Use Topics  . Smoking status: Former Smoker    Types: Cigarettes    Quit date: 12/06/1997  . Smokeless tobacco: Never Used     Comment: quit smoking 12-15 yrs. ago  . Alcohol Use: 7.0 oz/week    14 drink(s) per week     Comment: 2 drinks/day    No Known Allergies  Current Outpatient Prescriptions  Medication Sig Dispense Refill  . alendronate  (FOSAMAX) 70 MG tablet       . amLODipine (NORVASC) 5 MG tablet Take 5 mg by mouth at bedtime.       Marland Kitchen aspirin 81 MG tablet Take 81 mg by mouth daily.      . Cyanocobalamin (VITAMIN B 12 PO) Take 1 tablet by mouth at bedtime.       . furosemide (LASIX) 40 MG tablet Take 1 tablet (40 mg total) by mouth daily. For 7 days (swelling)  7 tablet  0  . glucose blood (ACCU-CHEK AVIVA) test strip 1 each by Other route daily. Use as instructed  100 each  5  . irbesartan (AVAPRO) 150 MG tablet       . Lancets MISC accu -chek aviva softclix lancets Test glucose one time daily Dx code: 790.29  100 each  5  . nitroGLYCERIN (NITROSTAT) 0.4 MG SL tablet Place 1 tablet (0.4 mg total) under the tongue every 5 (five) minutes as needed for chest pain.  25 tablet  3  . olmesartan (BENICAR) 20 MG tablet Take 0.5 tablets (10 mg total) by mouth daily.  90 tablet  1  . potassium chloride 20 MEQ TBCR Take 40 mEq by mouth daily.  14 tablet  0  . predniSONE (DELTASONE) 20 MG tablet        No current facility-administered medications for this visit.    Review of Systems Review of Systems  HENT: Negative.   Eyes: Negative.   Respiratory: Negative.   Cardiovascular: Negative.   Gastrointestinal: Negative.   Endocrine: Negative.   Neurological: Positive for weakness.    Blood pressure 148/88, pulse 71, temperature 97.3 F (36.3 C), temperature source Temporal, resp. rate 16, height 6\' 3"  (1.905 m), weight 242 lb 6.4 oz (109.952 kg).  Physical Exam Physical Exam  Constitutional: He is oriented to person, place, and time. He appears well-developed and well-nourished.  HENT:  Head: Normocephalic and atraumatic.  Eyes: Conjunctivae and EOM are normal. Pupils are equal, round, and reactive to light.  Neck: Normal range of motion. Neck supple.  Cardiovascular: Normal rate, regular rhythm and normal heart sounds.   Pulmonary/Chest: Effort normal and breath sounds normal.  Neurological: He is alert and oriented to  person, place, and time. He exhibits abnormal muscle tone (decreased quadricep and deltoid muscle strength.).  Skin: Skin is warm and dry.    Data Reviewed As above  Assessment    68 year old male with muscle weakness and the need for muscle biopsy.     Plan    1. We'll proceed to the operating room for a right lower extremity muscle biopsy. 2. Discussed with patient the risks and benefits of the procedure to include but not limited to: Infection, bleeding, damage to surrounding structures, any other unforeseen complications. The patient voiced understanding and wishes to proceed.        Ralene Ok 12/20/2013, 4:31 PM

## 2013-12-22 NOTE — Anesthesia Preprocedure Evaluation (Addendum)
Anesthesia Evaluation  Patient identified by MRN, date of birth, ID band Patient awake    Reviewed: Allergy & Precautions, H&P , NPO status , Patient's Chart, lab work & pertinent test results, reviewed documented beta blocker date and time   History of Anesthesia Complications Negative for: history of anesthetic complications  Airway Mallampati: II TM Distance: >3 FB Neck ROM: Full    Dental  (+) Teeth Intact, Dental Advisory Given, Caps,    Pulmonary neg shortness of breath, former smoker (quit '99),  Asthma as a child breath sounds clear to auscultation  Pulmonary exam normal       Cardiovascular hypertension, Pt. 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, EF 69%   Neuro/Psych  Neuromuscular disease (undiagnosed myositis) negative psych ROS   GI/Hepatic negative GI ROS, Neg liver ROS,   Endo/Other  negative endocrine ROSMorbid obesity  Renal/GU negative Renal ROS     Musculoskeletal negative musculoskeletal ROS (+)   Abdominal (+) + obese,   Peds  Hematology negative hematology ROS (+)   Anesthesia Other Findings   Reproductive/Obstetrics negative OB ROS                       Anesthesia Physical Anesthesia Plan  ASA: III  Anesthesia Plan: General   Post-op Pain Management:    Induction: Intravenous  Airway Management Planned: LMA  Additional Equipment:   Intra-op Plan:   Post-operative Plan:   Informed Consent: I have reviewed the patients History and Physical, chart, labs and discussed the procedure including the risks, benefits and alternatives for the proposed anesthesia with the patient  or authorized representative who has indicated his/her understanding and acceptance.   Dental advisory given  Plan Discussed with: CRNA and Surgeon  Anesthesia Plan Comments: (Plan routine monitors, GA- LMA OK)        Anesthesia Quick Evaluation

## 2013-12-22 NOTE — Anesthesia Postprocedure Evaluation (Signed)
  Anesthesia Post-op Note  Patient: Austin Keller  Procedure(s) Performed: Procedure(s): MUSCLE BIOPSY RIGHT LOWER EXTREMITY  (Right)  Patient Location: PACU  Anesthesia Type:General  Level of Consciousness: awake, alert , oriented and patient cooperative  Airway and Oxygen Therapy: Patient Spontanous Breathing  Post-op Pain: none  Post-op Assessment: Post-op Vital signs reviewed, Patient's Cardiovascular Status Stable, Respiratory Function Stable, Patent Airway, No signs of Nausea or vomiting and Pain level controlled  Post-op Vital Signs: Reviewed and stable  Last Vitals:  Filed Vitals:   12/22/13 0845  BP: 119/56  Pulse: 71  Temp: 36.7 C  Resp: 18    Complications: No apparent anesthesia complications

## 2013-12-22 NOTE — Interval H&P Note (Signed)
History and Physical Interval Note:  12/22/2013 6:55 AM  Austin Keller  has presented today for surgery, with the diagnosis of muscel weakness  The various methods of treatment have been discussed with the patient and family. After consideration of risks, benefits and other options for treatment, the patient has consented to  Procedure(s): MUSCLE BIOPSY RIGHT LOWER EXTREMITY  (Right) as a surgical intervention .  The patient's history has been reviewed, patient examined, no change in status, stable for surgery.  I have reviewed the patient's chart and labs.  Questions were answered to the patient's satisfaction.     Ralene Ok

## 2013-12-22 NOTE — Op Note (Signed)
12/22/2013  7:52 AM  PATIENT:  Austin Keller  68 y.o. male  PRE-OPERATIVE DIAGNOSIS:  muscle weakness  POST-OPERATIVE DIAGNOSIS:  muscle weakness  PROCEDURE:  Procedure(s): MUSCLE BIOPSY RIGHT LOWER EXTREMITY  (Right)  SURGEON:  Surgeon(s) and Role:    * Ralene Ok, MD - Primary  PHYSICIAN ASSISTANT:   ASSISTANTS: none   ANESTHESIA:   local and general  EBL:     BLOOD ADMINISTERED:none  DRAINS: none   LOCAL MEDICATIONS USED:  BUPIVICAINE   SPECIMEN:  Source of Specimen:  Right quadriceps muscle biopsy  DISPOSITION OF SPECIMEN:  PATHOLOGY  COUNTS:  YES  TOURNIQUET:  * No tourniquets in log *  DICTATION: .Dragon Dictation The patient was taken to the OR and placed in teh supine position with bilateral SCDs in place.  The patient was prepped and draped in the usual sterile fashion.  A time out was called and all facts were verified.  A 3cm incision was made over his R quadriceps muscle.  Sharp dissection was taken down to his fascia.  This was incised.  A measrued muscle of 2.5x 2cm was  Isolated with hemostats.  This was sharply excised.  Each cut end was tied off with 2-0 silk ties.  The specimen was sent to pathology.  I checked for hemostasis and it was excellent.  3-0 vicryl were used to reapproximate the deep dermal layer.  4-0 monocryls were used to reapproximate the skin in a subcuticular fashion.  Dermabond was used as a dressing.  The patient tolerated the procedure well.  He was taken to the recovery room in stable condition.    PLAN OF CARE: Discharge to home after PACU  PATIENT DISPOSITION:  PACU - hemodynamically stable.   Delay start of Pharmacological VTE agent (>24hrs) due to surgical blood loss or risk of bleeding: not applicable

## 2013-12-26 ENCOUNTER — Encounter (HOSPITAL_COMMUNITY): Payer: Self-pay | Admitting: General Surgery

## 2013-12-27 ENCOUNTER — Ambulatory Visit (INDEPENDENT_AMBULATORY_CARE_PROVIDER_SITE_OTHER): Payer: Medicare Other | Admitting: Internal Medicine

## 2013-12-27 ENCOUNTER — Encounter: Payer: Self-pay | Admitting: Internal Medicine

## 2013-12-27 VITALS — BP 122/64 | HR 92 | Temp 98.3°F | Ht 75.0 in | Wt 244.0 lb

## 2013-12-27 DIAGNOSIS — E559 Vitamin D deficiency, unspecified: Secondary | ICD-10-CM

## 2013-12-27 DIAGNOSIS — I2510 Atherosclerotic heart disease of native coronary artery without angina pectoris: Secondary | ICD-10-CM

## 2013-12-27 DIAGNOSIS — IMO0001 Reserved for inherently not codable concepts without codable children: Secondary | ICD-10-CM

## 2013-12-27 DIAGNOSIS — R740 Nonspecific elevation of levels of transaminase and lactic acid dehydrogenase [LDH]: Secondary | ICD-10-CM

## 2013-12-27 DIAGNOSIS — I10 Essential (primary) hypertension: Secondary | ICD-10-CM

## 2013-12-27 DIAGNOSIS — R7402 Elevation of levels of lactic acid dehydrogenase (LDH): Secondary | ICD-10-CM

## 2013-12-27 DIAGNOSIS — M6282 Rhabdomyolysis: Secondary | ICD-10-CM

## 2013-12-27 LAB — BASIC METABOLIC PANEL
BUN: 16 mg/dL (ref 6–23)
CALCIUM: 9.3 mg/dL (ref 8.4–10.5)
CO2: 31 meq/L (ref 19–32)
CREATININE: 0.6 mg/dL (ref 0.4–1.5)
Chloride: 100 mEq/L (ref 96–112)
GFR: 131.49 mL/min (ref >60.00)
GLUCOSE: 91 mg/dL (ref 70–99)
Potassium: 3.7 mEq/L (ref 3.5–5.1)
Sodium: 139 mEq/L (ref 135–145)

## 2013-12-27 LAB — HEPATIC FUNCTION PANEL
ALBUMIN: 3.5 g/dL (ref 3.5–5.2)
ALT: 290 U/L — AB (ref 0–53)
AST: 221 U/L — ABNORMAL HIGH (ref 0–37)
Alkaline Phosphatase: 35 U/L — ABNORMAL LOW (ref 39–117)
Bilirubin, Direct: 0.1 mg/dL (ref 0.0–0.3)
TOTAL PROTEIN: 6.1 g/dL (ref 6.0–8.3)
Total Bilirubin: 0.8 mg/dL (ref 0.2–1.2)

## 2013-12-27 LAB — CK: Total CK: 6281 U/L — ABNORMAL HIGH (ref 7–232)

## 2013-12-27 NOTE — Progress Notes (Signed)
Subjective:    Patient ID: Austin Keller, male    DOB: 1945-11-20, 68 y.o.   MRN: [id number]  HPI  68 year old white male with recent history of rhabdomyolysis and proximal muscle weakness for follow up.  His CK and muscle weakness did not improve with cessation of statin and trial of low dose prednisone.  He was seen rheumatologist - Dr. Charlestine Night.    He completed EMG/NCS and completed muscle biopsy.  I spoke with Dr Charlestine Night personally.  Patient's testing so far suggestive of polymyositis.   Patient started high dose prednisone 40 mg twice daily for last 8 days.  He has noticed minimal improvement.  His blood sugars are stable.  He reports slight increase in appetite.  He has been struggling with significant lower extremity edema.  Review of Systems Persistent proximal muscle weakness.  Lower extremity swelling worsens as day progresses    Past Medical History  Diagnosis Date  . Hyperlipidemia   . Dupuytren's contracture of hand 08/2011    left  . Speech disorder   . Hypertension     under control; has been on med. x 2-3 yrs.  Marland Kitchen CAD (coronary artery disease) 10/02/11    Moderate LAD disease with calcification. No obstructive CAD. LV function was normal.   . Asthma     as a child  . Shortness of breath   . Rhabdomyolysis     statin    History   Social History  . Marital Status: Married    Spouse Name: N/A    Number of Children: 3  . Years of Education: N/A   Occupational History  . 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 Smoking status: Former Smoker    Types: Cigarettes    Quit date: 12/06/1997  . Smokeless tobacco: Never Used     Comment: quit smoking 12-15 yrs. ago  . Alcohol Use: 10.5 oz/week    21 drink(s) per week     Comment: 2 drinks/day  . Drug Use: No  . Sexual Activity: Not on file   Other Topics Concern  . Not on file   Social History Narrative   Wife is 7 yrs younger- works in [employer]    Past Surgical History    Procedure Laterality Date  . Tonsillectomy      AS CHILD  . Hand surgery      left  . Colonoscopy    . Cardiac catheterization  10/02/2011    Moderate LAD disease with calcification. No obstructive CAD. Normal LV function.  . Dupuytren contracture release  11/10/2011    Procedure: DUPUYTREN CONTRACTURE RELEASE;  Surgeon: Cammie Sickle., MD;  Location: [facility];  Service: Orthopedics;  Laterality: Left;  palm, long, ring, small, thumb and index web left hand   . Muscle biopsy Right 12/22/2013    Procedure: MUSCLE BIOPSY RIGHT LOWER EXTREMITY ;  Surgeon: Ralene Ok, MD;  Location: [facility];  Service: General;  Laterality: Right;    Family History  Problem Relation Age of Onset  . Hip fracture Mother   . Ulcers Father     gastric    No Known Allergies  Current Outpatient Prescriptions on File Prior to Visit  Medication Sig Dispense Refill  . alendronate (FOSAMAX) 70 MG tablet Take 70 mg by mouth once a week.       Marland Kitchen aspirin 81 MG tablet Take 81 mg by mouth  daily.      . cholecalciferol (VITAMIN D) 1000 UNITS tablet Take 6,000 Units by mouth daily.      . Coenzyme Q10 (CO Q 10) 100 MG CAPS Take 1 capsule by mouth 2 (two) times daily.      Marland Kitchen glucose blood (ACCU-CHEK AVIVA) test strip 1 each by Other route daily. Use as instructed  100 each  5  . irbesartan (AVAPRO) 150 MG tablet Take 150 mg by mouth daily.       . Lancets MISC accu -chek aviva softclix lancets Test glucose one time daily Dx code: 790.29  100 each  5  . nitroGLYCERIN (NITROSTAT) 0.4 MG SL tablet Place 1 tablet (0.4 mg total) under the tongue every 5 (five) minutes as needed for chest pain.  25 tablet  3  . olmesartan (BENICAR) 20 MG tablet Take 0.5 tablets (10 mg total) by mouth daily.  90 tablet  1  . oxyCODONE-acetaminophen (ROXICET) 5-325 MG per tablet Take 1-2 tablets by mouth every 4 (four) hours as needed.  30 tablet  0  . predniSONE (DELTASONE) 20 MG tablet Take 40 mg by mouth 2 (two) times  daily with a meal.       . vitamin B-12 (CYANOCOBALAMIN) 1000 MCG tablet Take 1,000 mcg by mouth daily.       No current facility-administered medications on file prior to visit.    BP 122/64  Pulse 92  Temp(Src) 98.3 F (36.8 C) (Oral)  Ht 6\' 3"  (1.905 m)  Wt 244 lb (110.678 kg)  BMI 30.50 kg/m2    Objective:   Physical Exam  Constitutional: He is oriented to person, place, and time. He appears well-developed and well-nourished. No distress.  HENT:  Head: Normocephalic and atraumatic.  Eyes: EOM are normal. Pupils are equal, round, and reactive to light.  Cardiovascular: Normal rate, regular rhythm and normal heart sounds.   No murmur heard. Pulmonary/Chest: Effort normal and breath sounds normal. He has no wheezes.  Abdominal: Soft. Bowel sounds are normal. There is no tenderness.  Musculoskeletal: He exhibits edema.  Pitting edema up to knees bilaterally Proximal muscle weakness  Neurological: He is alert and oriented to person, place, and time. No cranial nerve deficit.  Skin: Skin is warm and dry.  Psychiatric: He has a normal mood and affect. His behavior is normal.          Assessment & Plan:

## 2013-12-27 NOTE — Progress Notes (Signed)
Pre visit review using our clinic review tool, if applicable. No additional management support is needed unless otherwise documented below in the visit note. 

## 2013-12-27 NOTE — Assessment & Plan Note (Signed)
Stable.  He has significant lower extremity edema despite adding furosemide 20 mg once daily.  Consider repeat 2D echo.

## 2013-12-27 NOTE — Assessment & Plan Note (Addendum)
Patient seen by rheumatologist.  Strong suspicion for polymyositis.  No significant clinical response to prednisone 40 mg bid x 1 week.  Dr Charlestine Night and I both agree on referral to tertiary medical center.   Continue vitamin D3 and CoQ10 supplementation.  Monitor CPK.

## 2014-01-01 ENCOUNTER — Encounter (HOSPITAL_COMMUNITY): Payer: Self-pay

## 2014-01-04 ENCOUNTER — Encounter (INDEPENDENT_AMBULATORY_CARE_PROVIDER_SITE_OTHER): Payer: Self-pay | Admitting: General Surgery

## 2014-01-04 ENCOUNTER — Telehealth (INDEPENDENT_AMBULATORY_CARE_PROVIDER_SITE_OTHER): Payer: Self-pay

## 2014-01-04 ENCOUNTER — Ambulatory Visit (INDEPENDENT_AMBULATORY_CARE_PROVIDER_SITE_OTHER): Payer: Medicare Other | Admitting: General Surgery

## 2014-01-04 VITALS — BP 134/70 | HR 75 | Temp 97.2°F | Ht 75.0 in | Wt 236.2 lb

## 2014-01-04 DIAGNOSIS — Z9889 Other specified postprocedural states: Secondary | ICD-10-CM

## 2014-01-04 NOTE — Progress Notes (Signed)
Patient ID: Austin Keller, male   DOB: [birth year], 68 y.o.   MRN: [id number] Post op course Patient has been doing well postoperatively. He said no complaints from surgery.  On Exam: Wound is healing well, clean dry and intact  Pathology:   MYOFIBER NECROSIS/REGENERATION AND MILD MYOFIBER ATROPHY IN SKELETAL MUSCLE SAMPLE WITHOUT SIGNIFICANT INFLAMMATION;.  This was discussed with the patient, and a copy was given to the patient.  Assessment and Plan 68 year old male status post right thigh muscle biopsy 1. Patient follow up as needed 2. Patient has a followup appointment with Dr. Charlestine Night on May 26.   Ralene Ok, MD [facility] General & Minimally Invasive Surgery Trauma & Emergency Surgery

## 2014-01-04 NOTE — Telephone Encounter (Signed)
Faxed copy of patients muscle biopsy report to Dr. Charlestine Night @ [phone].  Fax confirmation rec'd

## 2014-01-16 ENCOUNTER — Encounter (INDEPENDENT_AMBULATORY_CARE_PROVIDER_SITE_OTHER): Payer: Self-pay

## 2014-01-26 ENCOUNTER — Telehealth: Payer: Self-pay | Admitting: Internal Medicine

## 2014-01-26 ENCOUNTER — Other Ambulatory Visit: Payer: Self-pay | Admitting: Internal Medicine

## 2014-01-26 MED ORDER — IRBESARTAN 150 MG PO TABS: ORAL_TABLET | ORAL | Status: DC

## 2014-01-26 NOTE — Telephone Encounter (Signed)
rx sent in electronically 

## 2014-01-26 NOTE — Telephone Encounter (Signed)
Pt need refill irbesartan 150 mg #90 w/refills call into [facility]

## 2014-02-26 ENCOUNTER — Encounter (INDEPENDENT_AMBULATORY_CARE_PROVIDER_SITE_OTHER): Payer: Self-pay

## 2014-03-09 ENCOUNTER — Encounter: Payer: Self-pay | Admitting: Internal Medicine

## 2014-03-16 DIAGNOSIS — I509 Heart failure, unspecified: Secondary | ICD-10-CM | POA: Insufficient documentation

## 2014-03-16 DIAGNOSIS — I10 Essential (primary) hypertension: Secondary | ICD-10-CM | POA: Insufficient documentation

## 2014-03-28 ENCOUNTER — Encounter: Payer: Self-pay | Admitting: Physical Medicine & Rehabilitation

## 2014-04-02 ENCOUNTER — Telehealth: Payer: Self-pay | Admitting: Critical Care Medicine

## 2014-04-02 NOTE — Telephone Encounter (Signed)
Attempted to call crystal back at Dr. Shauna Hugh office.  Will try back tomorrow.

## 2014-04-02 NOTE — Telephone Encounter (Signed)
Ok to schedule after 8/31

## 2014-04-02 NOTE — Telephone Encounter (Signed)
Please advise Dr. Joya Gaskins thanks

## 2014-04-03 NOTE — Telephone Encounter (Signed)
Called Dr. Elmon Else office. Was advised Austin Keller is off today and to call back tomorrow. WCB

## 2014-04-04 NOTE — Telephone Encounter (Signed)
Appt set for 04-25-14. Crystal is aware.[place] Bing, CMA

## 2014-04-09 ENCOUNTER — Encounter: Payer: Self-pay | Admitting: Internal Medicine

## 2014-04-11 ENCOUNTER — Ambulatory Visit (INDEPENDENT_AMBULATORY_CARE_PROVIDER_SITE_OTHER): Payer: Medicare Other

## 2014-04-11 ENCOUNTER — Ambulatory Visit: Payer: Self-pay

## 2014-04-11 VITALS — BP 132/73 | HR 76 | Resp 15 | Ht 75.0 in | Wt 208.0 lb

## 2014-04-11 DIAGNOSIS — I2510 Atherosclerotic heart disease of native coronary artery without angina pectoris: Secondary | ICD-10-CM

## 2014-04-11 DIAGNOSIS — Q828 Other specified congenital malformations of skin: Secondary | ICD-10-CM

## 2014-04-11 DIAGNOSIS — B070 Plantar wart: Secondary | ICD-10-CM

## 2014-04-11 NOTE — Patient Instructions (Signed)

## 2014-04-11 NOTE — Progress Notes (Signed)
   Subjective:    Patient ID: Austin Keller, male    DOB: 1946-07-25, 68 y.o.   MRN: [id number]  HPI Comments: Pt states Dr. Acey Lav down the right 5th plantar MPJ about every 9 months when it becomes painful, as it is now.     Review of Systems  Musculoskeletal:       Muscle weakness due to Lipitor last 6 months.  Hematological: Bruises/bleeds easily.  All other systems reviewed and are negative.      Objective:   Physical Exam 68-year-old white male well-developed well-nourished oriented x3 presents at this time with recurrence of painful nucleated keratotic lesion sub-fifth MTP area of the right foot. Patient has been having increasing has had debridement past has been using salicylic acid under occlusion with duct tape the past however continues to recur since last he is seen patient had issues had difficulty with his Lipitor causing muscle pain developed a reaction to medications and an' hospitalized for. Time on heavy-duty steroids and antibiotic medications and I G. infusions seems to be doing stable at this time. Does have ecchymosis on both arms secondary to anticoagulant Lovenox therapies and prednisone therapy. 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 We'll applied daily as instructed and debridement area for 7-14 days regimen as instructed reappointed future and as-needed basis if there's any recurrence or further exacerbations. Patient is reminded is likely noncovered service to Medicare followup in the future as needed if not resolved or if there's any exacerbations her knee difficulties. Patient is advised he may be a higher risk because of long-term prednisone use for infections or complications and to contact us immediately if something occurs  Harriet Masson DPM

## 2014-04-25 ENCOUNTER — Encounter: Payer: Self-pay | Admitting: Critical Care Medicine

## 2014-04-25 ENCOUNTER — Other Ambulatory Visit (INDEPENDENT_AMBULATORY_CARE_PROVIDER_SITE_OTHER): Payer: Medicare Other

## 2014-04-25 ENCOUNTER — Ambulatory Visit: Payer: Self-pay

## 2014-04-25 ENCOUNTER — Ambulatory Visit (INDEPENDENT_AMBULATORY_CARE_PROVIDER_SITE_OTHER)
Admission: RE | Admit: 2014-04-25 | Discharge: 2014-04-25 | Disposition: A | Discharge status: Home / Self Care | Payer: Medicare Other | Source: Ambulatory Visit | Attending: Critical Care Medicine | Admitting: Critical Care Medicine

## 2014-04-25 ENCOUNTER — Ambulatory Visit (INDEPENDENT_AMBULATORY_CARE_PROVIDER_SITE_OTHER): Payer: Medicare Other | Admitting: Critical Care Medicine

## 2014-04-25 VITALS — BP 108/70 | HR 76 | Temp 97.6°F | Ht 75.0 in | Wt 213.4 lb

## 2014-04-25 DIAGNOSIS — R918 Other nonspecific abnormal finding of lung field: Secondary | ICD-10-CM

## 2014-04-25 DIAGNOSIS — J449 Chronic obstructive pulmonary disease, unspecified: Secondary | ICD-10-CM

## 2014-04-25 DIAGNOSIS — M3320 Polymyositis, organ involvement unspecified: Secondary | ICD-10-CM

## 2014-04-25 DIAGNOSIS — I2510 Atherosclerotic heart disease of native coronary artery without angina pectoris: Secondary | ICD-10-CM

## 2014-04-25 DIAGNOSIS — J4489 Other specified chronic obstructive pulmonary disease: Secondary | ICD-10-CM

## 2014-04-25 LAB — COMPREHENSIVE METABOLIC PANEL
ALT: 15 U/L (ref 0–53)
AST: 20 U/L (ref 0–37)
Albumin: 3.6 g/dL (ref 3.5–5.2)
Alkaline Phosphatase: 34 U/L — ABNORMAL LOW (ref 39–117)
BUN: 26 mg/dL — ABNORMAL HIGH (ref 6–23)
CO2: 29 mEq/L (ref 19–32)
Calcium: 9.5 mg/dL (ref 8.4–10.5)
Chloride: 102 mEq/L (ref 96–112)
Creatinine, Ser: 0.9 mg/dL (ref 0.4–1.5)
GFR: 85.35 mL/min (ref >60.00)
Glucose, Bld: 105 mg/dL — ABNORMAL HIGH (ref 70–99)
Potassium: 4.7 mEq/L (ref 3.5–5.1)
Sodium: 139 mEq/L (ref 135–145)
Total Bilirubin: 0.7 mg/dL (ref 0.2–1.2)
Total Protein: 7.2 g/dL (ref 6.0–8.3)

## 2014-04-25 LAB — CORTISOL: Cortisol, Plasma: 5.2 ug/dL

## 2014-04-25 LAB — MAGNESIUM: Magnesium: 2.2 mg/dL (ref 1.5–2.5)

## 2014-04-25 LAB — PHOSPHORUS: PHOSPHORUS: 3.2 mg/dL (ref 2.3–4.6)

## 2014-04-25 NOTE — Patient Instructions (Signed)
Chest xray today Labs today Hold lasix x 3 days then resume 20 mg once daily thereafter Stop Bactrim Will schedule a Full PFT Follow up in 1 month with Dr. Joya Gaskins.

## 2014-04-25 NOTE — Progress Notes (Signed)
Subjective:    Patient ID: Austin Keller, male    DOB: Jul 12, 1946, 68 y.o.   MRN: [id number]  HPI Comments: Pt has been in [facility] ICU:  7/26 until 03/24/14.  Pt was on bipap.  Pt there d/t pulm issues.  Pt was seen by [facility] Rheum: IV IgG infusion x 2 days. Pt worse with dyspnea, Pt admitted to [facility].  ?chemically induced pulm infection.   Pt with polymyositis dx : 11/2013.  Muscle bx: at [facility]. Symptoms are weakness in muscles.  Ck 14000 when adm at [facility] 11/2013:  CK down to 9000. Later dx clarified as polymyositis Pt has lost weight 254 down to 208   Pt was on high dose pred: 80/d later to titrate down to 20/d .  Dyspnea is better now, no cough. No recent CXR.   Hematuria eval: no issues seen  . No PFTs   Pt on septra MWF.  Per [facility]. Pt notes dizziness since taking bactrim Pt has fallen twice .  Pt has seen PT in the home   Shortness of Breath Associated symptoms include leg swelling. Pertinent negatives include no abdominal pain, chest pain, ear pain, fever, headaches, neck pain, rash, rhinorrhea, sore throat, vomiting or wheezing.   Past Medical History  Diagnosis Date  . Hyperlipidemia   . Dupuytren's contracture of hand 08/2011    left  . Speech disorder   . Hypertension     under control; has been on med. x 2-3 yrs.  Marland Kitchen CAD (coronary artery disease) 10/02/11    Moderate LAD disease with calcification. No obstructive CAD. LV function was normal.   . Asthma     as a child  . Shortness of breath   . Rhabdomyolysis     statin     Family History  Problem Relation Age of Onset  . Hip fracture Mother   . Ulcers Father     gastric     History   Social History  . Marital Status: Married    Spouse Name: N/A    Number of Children: 3  . Years of Education: N/A   Occupational History  . 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 Smoking status: Former Smoker -- 1.50 packs/day for 20 years    Types: Cigarettes    Quit date: 08/24/1996  .  Smokeless tobacco: Never Used     Comment: quit smoking 12-15 yrs. ago  . Alcohol Use: 10.5 oz/week    21 drink(s) per week     Comment: 2 drinks/day  . Drug Use: No  . Sexual Activity: Not on file   Other Topics Concern  . Not on file   Social History Narrative   Wife is 7 yrs younger- works in [employer]  . Statins Other (See Comments)    Statin-induced myopathy.     Outpatient Prescriptions Prior to Visit  Medication Sig Dispense Refill  . aspirin 81 MG tablet Take 81 mg by mouth daily.      . cholecalciferol (VITAMIN D) 1000 UNITS tablet Take by mouth daily.       . nitroGLYCERIN (NITROSTAT) 0.4 MG SL tablet Place 1 tablet (0.4 mg total) under the tongue every 5 (five) minutes as needed for chest pain.  25 tablet  3  . predniSONE (DELTASONE) 20 MG tablet Take 40 mg by mouth daily.       Marland Kitchen  vitamin B-12 (CYANOCOBALAMIN) 1000 MCG tablet Take 1,000 mcg by mouth daily.      Marland Kitchen glucose blood (ACCU-CHEK AVIVA) test strip 1 each by Other route daily. Use as instructed  100 each  5  . Lancets MISC accu -chek aviva softclix lancets Test glucose one time daily Dx code: 790.29  100 each  5  . Coenzyme Q10 (CO Q 10) 100 MG CAPS Take 1 capsule by mouth 2 (two) times daily.      . furosemide (LASIX) 20 MG tablet Take 1 tablet by mouth daily.      . irbesartan (AVAPRO) 150 MG tablet TAKE ONE TABLET BY MOUTH ONCE DAILY  90 tablet  1  . olmesartan (BENICAR) 20 MG tablet Take 0.5 tablets (10 mg total) by mouth daily.  90 tablet  1   No facility-administered medications prior to visit.    Review of Systems  Constitutional: Positive for unexpected weight change. Negative for fever, chills, diaphoresis, activity change, appetite change and fatigue.  HENT: Negative for congestion, dental problem, ear discharge, ear pain, facial swelling, hearing loss, mouth sores, nosebleeds, postnasal drip, rhinorrhea, sinus pressure, sneezing, sore throat, tinnitus, trouble  swallowing and voice change.   Eyes: Negative for photophobia, discharge, itching and visual disturbance.  Respiratory: Positive for shortness of breath. Negative for apnea, cough, choking, chest tightness, wheezing and stridor.   Cardiovascular: Positive for leg swelling. Negative for chest pain and palpitations.  Gastrointestinal: Negative for nausea, vomiting, abdominal pain, constipation, blood in stool and abdominal distention.  Genitourinary: Negative for dysuria, urgency, frequency, hematuria, flank pain, decreased urine volume and difficulty urinating.  Musculoskeletal: Positive for back pain and gait problem. Negative for arthralgias, joint swelling, myalgias, neck pain and neck stiffness.  Skin: Negative for color change, pallor and rash.  Neurological: Positive for dizziness and weakness. Negative for tremors, seizures, syncope, speech difficulty, light-headedness, numbness and headaches.  Hematological: Negative for adenopathy. Bruises/bleeds easily.  Psychiatric/Behavioral: Negative for confusion, sleep disturbance and agitation. The patient is not nervous/anxious.        Objective:   Physical Exam Filed Vitals:   04/25/14 1116  BP: 108/70  Pulse: 76  Temp: 97.6 F (36.4 C)  TempSrc: Oral  Height: 6\' 3"  (1.905 m)  Weight: 213 lb 6.4 oz (96.798 kg)  SpO2: 96%    Gen: Pleasant, well-nourished, in no distress,  normal affect  ENT: No lesions,  mouth clear,  oropharynx clear, no postnasal drip  Neck: No JVD, no TMG, no carotid bruits  Lungs: No use of accessory muscles, no dullness to percussion, distant BS  Cardiovascular: RRR, heart sounds normal, no murmur or gallops, no peripheral edema  Abdomen: soft and NT, no HSM,  BS normal  Musculoskeletal: No deformities, no cyanosis or clubbing, motor weakness demonstrated but apparently has improved  Neuro: alert, non focal  Skin: Warm, excoriation of her left knee and also left hand on the dorsum  Dg Chest 2  View  04/25/2014   CLINICAL DATA:  Former smoker, hypertension, coronary artery disease, asthma, followup  EXAM: CHEST  2 VIEW  COMPARISON:  11/01/2013  FINDINGS: Normal heart size, mediastinal contours, and pulmonary vascularity.  Minimal chronic peribronchial thickening.  Slight accentuation of markings in LEFT mid lung question scarring.  No definite acute infiltrate, pleural effusion or pneumothorax.  Bones unremarkable.  IMPRESSION: Minimal chronic bronchitic changes and suspected LEFT mid lung scarring.  No acute abnormalities.   Electronically Signed   By: Lavonia Dana M.D.   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 Currently the patient's myositis is improving on low-dose prednisone. The patient's currently off methotrexate at this time. Plan Per rheumatology The rapid taper and prednisone with resultant weakness raised a concern for hypoadrenalism however today his cortisol level was normal  Pulmonary infiltrate History of pulmonary infiltrates noted in July 2015. This was likely do to adverse reaction to gammaglobulin infusions and associated, Neri edema. Methotrexate lung injury could not be excluded. No current chest x-ray is clear and there is no evidence of active pneumonitis at this time. It is unlikely pulmonary process related to the patient's underlying polymyositis. The patient had associated diffuse motor weakness with this exacerbation Plan The patient has been significantly over diuresed and at this point is dry therefore Lasix can be held and then resumed at a lower dose 20 mg daily It would be impossible to avoid further gammaglobulin infusions and methotrexate No other additional pulmonary medications indicated  Moderate orthostatic hypotension likely from overdiuresis Plan Hold Lasix for 3 days then resume at 20 mg  daily  Evidence of emphysematous change on CT scan Plan Obtain pulmonary function studies  We'll also discontinue Septra for now Updated Medication List Outpatient Encounter Prescriptions as of 04/25/2014  Medication Sig  . alendronate (FOSAMAX) 70 MG tablet once a week.  Marland Kitchen aspirin 81 MG tablet Take 81 mg by mouth daily.  . calcium-vitamin D (CALCIUM 500/D) 500-200 MG-UNIT per tablet Take 1 tablet by mouth 2 (two) times daily.  . cholecalciferol (VITAMIN D) 1000 UNITS tablet Take by mouth daily.   . folic acid (FOLVITE) 1 MG tablet Take 1 mg by mouth daily.  . furosemide (LASIX) 40 MG tablet Take by mouth. Hold x 3 days then resume 20 mg once daily thereafter  . nitroGLYCERIN (NITROSTAT) 0.4 MG SL tablet Place 1 tablet (0.4 mg total) under the tongue every 5 (five) minutes as needed for chest pain.  . pantoprazole (PROTONIX) 20 MG tablet Take 20 mg by mouth daily.  . predniSONE (DELTASONE) 20 MG tablet Take 40 mg by mouth daily.   . vitamin B-12 (CYANOCOBALAMIN) 1000 MCG tablet Take 1,000 mcg by mouth daily.  Marland Kitchen glucose blood (ACCU-CHEK AVIVA) test strip 1 each by Other route daily. Use as instructed  . Lancets MISC accu -chek aviva softclix lancets Test glucose one time daily Dx code: 790.29  . [DISCONTINUED] Coenzyme Q10 (CO Q 10) 100 MG CAPS Take 1 capsule by mouth 2 (two) times daily.  . [DISCONTINUED] furosemide (LASIX) 20 MG tablet Take 1 tablet by mouth daily.  . [DISCONTINUED] irbesartan (AVAPRO) 150 MG tablet TAKE ONE TABLET BY MOUTH ONCE DAILY  . [DISCONTINUED] olmesartan (BENICAR) 20 MG tablet Take 0.5 tablets (10 mg total) by mouth daily.  . [DISCONTINUED] sulfamethoxazole-trimethoprim (BACTRIM DS) 800-160 MG per tablet Take 1 tablet by mouth 3 (three) times a week.

## 2014-04-25 NOTE — Progress Notes (Signed)
Quick Note:  Call pt and tell him labs are ok, No change in medications from what we recommended this AM ______

## 2014-04-25 NOTE — Progress Notes (Signed)
Quick Note:  Notify the patient that the Xray is stable and no pneumonia or lung inflammation or fluid  No change in medications are recommended. Continue current meds as prescribed at last office visit ______

## 2014-04-26 DIAGNOSIS — R918 Other nonspecific abnormal finding of lung field: Secondary | ICD-10-CM | POA: Insufficient documentation

## 2014-04-26 NOTE — Progress Notes (Signed)
Quick Note:  Called, spoke with pt. Informed him of lab results and recs per Dr. Wright. He verbalized understanding and voiced no further questions or concerns at this time. ______ 

## 2014-04-26 NOTE — Assessment & Plan Note (Addendum)
History of pulmonary infiltrates noted in July 2015. This was likely do to adverse reaction to gammaglobulin infusions and associated, Neri edema. Methotrexate lung injury could not be excluded. No current chest x-ray is clear and there is no evidence of active pneumonitis at this time. It is unlikely pulmonary process related to the patient's underlying polymyositis. The patient had associated diffuse motor weakness with this exacerbation Plan The patient has been significantly over diuresed and at this point is dry therefore Lasix can be held and then resumed at a lower dose 20 mg daily It would be impossible to avoid further gammaglobulin infusions and methotrexate No other additional pulmonary medications indicated

## 2014-04-26 NOTE — Progress Notes (Signed)
Quick Note:  Called, spoke with pt. Informed him of cxr results and recs per Dr. Wright. He verbalized understanding and voiced no further questions or concerns at this time. ______ 

## 2014-04-26 NOTE — Assessment & Plan Note (Signed)
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 Currently the patient's myositis is improving on low-dose prednisone. The patient's currently off methotrexate at this time. Plan Per rheumatology The rapid taper and prednisone with resultant weakness raised a concern for hypoadrenalism however today his cortisol level was normal

## 2014-05-16 ENCOUNTER — Other Ambulatory Visit: Payer: Self-pay

## 2014-05-16 MED ORDER — NITROGLYCERIN 0.4 MG SL SUBL: 0.4000 mg | SUBLINGUAL_TABLET | SUBLINGUAL | Status: AC | PRN

## 2014-06-04 ENCOUNTER — Ambulatory Visit: Payer: Medicare Other | Admitting: Critical Care Medicine

## 2014-06-13 ENCOUNTER — Ambulatory Visit: Payer: Medicare Other | Admitting: Critical Care Medicine

## 2014-06-13 ENCOUNTER — Ambulatory Visit (INDEPENDENT_AMBULATORY_CARE_PROVIDER_SITE_OTHER): Payer: Medicare Other | Admitting: Adult Health

## 2014-06-13 ENCOUNTER — Encounter: Payer: Self-pay | Admitting: Adult Health

## 2014-06-13 ENCOUNTER — Ambulatory Visit (INDEPENDENT_AMBULATORY_CARE_PROVIDER_SITE_OTHER): Payer: Medicare Other | Admitting: Critical Care Medicine

## 2014-06-13 VITALS — BP 124/68 | HR 76 | Temp 97.6°F | Ht 73.0 in | Wt 222.0 lb

## 2014-06-13 DIAGNOSIS — R918 Other nonspecific abnormal finding of lung field: Secondary | ICD-10-CM

## 2014-06-13 DIAGNOSIS — I2510 Atherosclerotic heart disease of native coronary artery without angina pectoris: Secondary | ICD-10-CM

## 2014-06-13 DIAGNOSIS — J449 Chronic obstructive pulmonary disease, unspecified: Secondary | ICD-10-CM

## 2014-06-13 LAB — PULMONARY FUNCTION TEST
DL/VA % pred: 77 %
DL/VA: 3.7 ml/min/mmHg/L
DLCO UNC % PRED: 69 %
DLCO UNC: 25.3 ml/min/mmHg
FEF 25-75 PRE: 1.47 L/s
FEF 25-75 Post: 2.05 L/sec
FEF2575-%Change-Post: 40 %
FEF2575-%PRED-POST: 74 %
FEF2575-%Pred-Pre: 53 %
FEV1-%CHANGE-POST: 7 %
FEV1-%Pred-Post: 81 %
FEV1-%Pred-Pre: 75 %
FEV1-PRE: 2.75 L
FEV1-Post: 2.96 L
FEV1FVC-%Change-Post: 1 %
FEV1FVC-%PRED-PRE: 90 %
FEV6-%Change-Post: 6 %
FEV6-%PRED-POST: 91 %
FEV6-%PRED-PRE: 85 %
FEV6-POST: 4.26 L
FEV6-Pre: 4.01 L
FEV6FVC-%CHANGE-POST: 0 %
FEV6FVC-%PRED-POST: 102 %
FEV6FVC-%PRED-PRE: 102 %
FVC-%Change-Post: 5 %
FVC-%PRED-PRE: 83 %
FVC-%Pred-Post: 88 %
FVC-POST: 4.36 L
FVC-PRE: 4.12 L
Post FEV1/FVC ratio: 68 %
Post FEV6/FVC ratio: 98 %
Pre FEV1/FVC ratio: 67 %
Pre FEV6/FVC Ratio: 97 %
RV % PRED: 136 %
RV: 3.57 L
TLC % PRED: 109 %
TLC: 8.33 L

## 2014-06-13 NOTE — Progress Notes (Signed)
Subjective:    Patient ID: Austin Keller, male    DOB: 03-30-1946, 68 y.o.   MRN: [id number]  HPI  HPI Comments: Pt has been in hosp [facility] ICU:  7/26 until 03/24/14.  Pt was on bipap.  Pt there d/t pulm issues.  Pt was seen by [facility] Rheum: IV IgG infusion x 2 days. Pt worse with dyspnea, Pt admitted to [facility].  ?chemically induced pulm infection.   Pt with polymyositis dx : 11/2013.  [facility]. Symptoms are weakness in muscles.  Ck 14000 when adm [facility] 11/2013:  CK down to 9000. Later dx clarified as polymyositis Pt has lost weight 254 down to 208   Pt was on high dose pred: 80/d later to titrate down to 20/d .  Dyspnea is better now, no cough. No recent CXR.   Hematuria eval: no issues seen  . No PFTs   Pt on septra MWF.  Per [facility]. Pt notes dizziness since taking bactrim Pt has fallen twice .  Pt has seen PT in the home   06/13/2014 Follow up with PFT  Pt returns for 1 month follow up .  Seen last month for pulmonary consult for pulmonary infiltrates felt secondary to adverse gammaglobulin infusion . She has hx of necrotizing polymyositis .  Going to PT , strength is getting better.  Tapering prednisone slowly over next 2 months to 5mg  by 07/24/14 .  PFT today shows FEV1 75%, ratio 67 , FVC 83%, no sign BD response.  midflows decreased with BD response  Mildly decreased diffusing capacity at 69%.  Denies chest pain orthopnea, edema or fever.  Denies cough. Says shortness of breath is getting better.  Last cxr 04/2014 with mild peribronchial thickening and mid lung scarring.    Review of Systems Constitutional:   No  weight loss, night sweats,  Fevers, chills,  +fatigue, or  lassitude.  HEENT:   No headaches,  Difficulty swallowing,  Tooth/dental problems, or  Sore throat,                No sneezing, itching, ear ache, nasal congestion, post nasal drip,   CV:  No chest pain,  Orthopnea, PND, swelling in lower extremities, anasarca, dizziness, palpitations, syncope.   GI  No  heartburn, indigestion, abdominal pain, nausea, vomiting, diarrhea, change in bowel habits, loss of appetite, bloody stools.   Resp:  .  No chest wall deformity  Skin: no rash or lesions.  GU: no dysuria, change in color of urine, no urgency or frequency.  No flank pain, no hematuria   Psych:  No change in mood or affect. No depression or anxiety.  No memory loss.         Objective:   Physical Exam GEN: A/Ox3; pleasant , NAD,  HEENT:  [place]/AT,  EACs-clear, TMs-wnl, NOSE-clear, THROAT-clear, no lesions, no postnasal drip or exudate noted.   NECK:  Supple w/ fair ROM; no JVD; normal carotid impulses w/o bruits; no thyromegaly or nodules palpated; no lymphadenopathy.  RESP  Decreased BS in bases no accessory muscle use, no dullness to percussion  CARD:  RRR, no m/r/g  , no peripheral edema, pulses intact, no cyanosis or clubbing.  GI:   Soft & nt; nml bowel sounds; no organomegaly or masses detected.  Musco: Warm bil, no deformities or joint swelling noted.   Neuro: alert, no focal deficits noted.    Skin: Warm, no lesions or rashes  Assessment & Plan:

## 2014-06-13 NOTE — Progress Notes (Signed)
PFT done today. 

## 2014-06-13 NOTE — Patient Instructions (Signed)
Continue on current regimen  follow up Dr. Joya Gaskins  In 2 months and As needed

## 2014-06-14 ENCOUNTER — Other Ambulatory Visit: Payer: Self-pay | Admitting: Critical Care Medicine

## 2014-06-15 NOTE — Progress Notes (Signed)
Quick Note:  Called, spoke with pt. Informed him of PFT results and recs per Dr. Joya Gaskins. He verbalized understanding and voiced no further questions or concerns at this time. ______

## 2014-06-18 NOTE — Assessment & Plan Note (Addendum)
History of pulmonary infiltrates noted in July 2015. This was likely do to adverse reaction to gammaglobulin infusions and associated  Methotrexate lung injury could not be excluded.  PFT today shows mild airflow obstruction -she is essentially asymptomatic .  Diffusing capacity minimally decreased  Will hold off on inhalers at this time  Have her continue on current regimen  follow up Dr. Joya Gaskins  In 2 months and As needed

## 2014-07-04 ENCOUNTER — Ambulatory Visit: Payer: Medicare Other | Admitting: *Deleted

## 2014-07-04 ENCOUNTER — Ambulatory Visit (INDEPENDENT_AMBULATORY_CARE_PROVIDER_SITE_OTHER): Payer: Medicare Other | Admitting: *Deleted

## 2014-07-04 DIAGNOSIS — Z23 Encounter for immunization: Secondary | ICD-10-CM

## 2014-07-18 ENCOUNTER — Ambulatory Visit (INDEPENDENT_AMBULATORY_CARE_PROVIDER_SITE_OTHER): Payer: Medicare Other | Admitting: Cardiology

## 2014-07-18 VITALS — BP 126/62 | HR 85 | Ht 73.0 in | Wt 226.0 lb

## 2014-07-18 DIAGNOSIS — I259 Chronic ischemic heart disease, unspecified: Secondary | ICD-10-CM

## 2014-07-18 DIAGNOSIS — E7800 Pure hypercholesterolemia, unspecified: Secondary | ICD-10-CM

## 2014-07-18 DIAGNOSIS — I2510 Atherosclerotic heart disease of native coronary artery without angina pectoris: Secondary | ICD-10-CM

## 2014-07-18 DIAGNOSIS — I2583 Coronary atherosclerosis due to lipid rich plaque: Secondary | ICD-10-CM

## 2014-07-18 DIAGNOSIS — I5032 Chronic diastolic (congestive) heart failure: Secondary | ICD-10-CM

## 2014-07-18 NOTE — Assessment & Plan Note (Signed)
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  He is controlling his cholesterol with diet alone at this point.

## 2014-07-18 NOTE — Assessment & Plan Note (Signed)
The patient has not been experiencing any recurrent chest pain or angina pectoris.

## 2014-07-18 NOTE — Assessment & Plan Note (Signed)
The patient is not having any symptoms of congestive heart failure at the present time.  He is on Lasix 20 mg daily.  He is tapering his prednisone under the direction of Dr. Charlestine Night.

## 2014-07-18 NOTE — Patient Instructions (Signed)
Your physician wants you to follow-up in: 6 months  You will receive a reminder letter in the mail two months in advance. If you don't receive a letter, please call our office to schedule the follow-up appointment.  Your physician recommends that you continue on your current medications as directed. Please refer to the Current Medication list given to you today.  

## 2014-07-18 NOTE — Progress Notes (Signed)
Austin Keller Date of Birth:  02-Jun-1946 [facility] [address] [place], [place]  [address] [phone]  Fax   [phone]  HPI: This pleasant 68 year old gentleman is seen for a scheduled followup office visit. We saw him initially in January 2013 for preoperative cardiac  clearance for surgery on his left knee. At the time of his preoperative EKG had a lot of PVCs. He was noted to have multiple risk factors for premature coronary disease. He had an abnormal stress test which led to a cardiac catheterization. His catheterization showed moderate LAD disease without obstruction and he had normal LV function. He  went ahead and had successful surgery by Dr. Daylene Katayama.  The patient reports that in March 2015 he was hospitalized for severe myalgias.  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  He is now off statins.  He still has leg weakness and poor balance and walks with a cane.  He is scheduled to start physical therapy in [place] where he lives. The patient reports that in July at [facility] rheumatology he was given an infusion of intravenous immunoglobulin. Several days later his breathing became markedly worse and he was airlifted back to [facility].  Review of outside records indicates a diagnosis of methotrexate toxicity and acute respiratory failure with hypoxemia.  Patient states that he was in the intensive care unit for 6 days.  Echocardiogram done at [facility] on 03/19/14 showed normal systolic function with an ejection fraction of 55-60% and there was diastolic dysfunction. Current Outpatient Prescriptions  Medication Sig Dispense Refill  . alendronate (FOSAMAX) 70 MG tablet once a week.    Marland Kitchen aspirin 81 MG tablet Take 81 mg by mouth daily.    . calcium-vitamin D (CALCIUM 500/D) 500-200 MG-UNIT per tablet Take 1 tablet by mouth 2 (two) times daily.    . cholecalciferol (VITAMIN D) 1000 UNITS tablet Take by mouth daily.      . folic acid (FOLVITE) 1 MG tablet Take 1 mg by mouth daily.    . furosemide (LASIX) 40 MG tablet Take 20 mg by mouth as directed. 1/2 tablet daily    . nitroGLYCERIN (NITROSTAT) 0.4 MG SL tablet Place 1 tablet (0.4 mg total) under the tongue every 5 (five) minutes as needed for chest pain. 25 tablet 3  . pantoprazole (PROTONIX) 20 MG tablet Take 20 mg by mouth daily.    . predniSONE (DELTASONE) 20 MG tablet Take 10 mg by mouth daily.     . vitamin B-12 (CYANOCOBALAMIN) 1000 MCG tablet Take 1,000 mcg by mouth daily.     No current facility-administered medications for this visit.    Allergies  Allergen Reactions  . Statins Other (See Comments)    Statin-induced myopathy.    Patient Active Problem List   Diagnosis Date Noted  . Pulmonary infiltrate 04/26/2014  . CCF (congestive cardiac failure) 03/16/2014  . Benign hypertension 03/16/2014  . Polymyositis 11/01/2013  . Lower extremity edema 11/01/2013  . Unspecified vitamin B deficiency 02/29/2012  . CAD (coronary artery disease) 10/16/2011  . B12 deficiency 08/28/2011  . Preventative health care 08/28/2011  . FOOT PAIN 06/03/2010  . DUPUYTREN'S CONTRACTURE, LEFT 02/07/2009  . OBESITY 10/09/2008  . 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 ALLERGIC RHINITIS 08/15/2008  . CONTACT DERMATITIS 12/09/2007  . HYPERLIPIDEMIA 10/12/2007  . HYPERTENSION 10/12/2007  . OTHER ABNORMAL GLUCOSE 10/06/2007    History  Smoking status  . Former Smoker -- 1.50  packs/day for 20 years  . Types: Cigarettes  . Quit date: 08/24/1996  Smokeless tobacco  . Never Used    Comment: quit smoking 12-15 yrs. ago    History  Alcohol Use  . 10.5 oz/week  . 21 drink(s) per week    Comment: 2 drinks/day    Family History  Problem Relation Age of Onset  . Hip fracture Mother   . Ulcers Father     gastric    Review of Systems: The patient denies any heat or cold intolerance.  No weight gain or weight loss.  The patient denies headaches or blurry vision.   There is no cough or sputum production.  The patient denies dizziness.  There is no hematuria or hematochezia.  The patient denies any muscle aches or arthritis.  The patient denies any rash.  The patient denies frequent falling or instability.  There is no history of depression or anxiety.  All other systems were reviewed and are negative.   Physical Exam: Filed Vitals:   07/18/14 0921  BP: 126/62  Pulse: 85   the general appearance reveals a well-developed well-nourished middle-aged gentleman in no distress.The head and neck exam reveals pupils equal and reactive.  Extraocular movements are full.  There is no scleral icterus.  The mouth and pharynx are normal.  The neck is supple.  The carotids reveal no bruits.  The jugular venous pressure is normal.  The  thyroid is not enlarged.  There is no lymphadenopathy.  The chest is clear to percussion and auscultation.  There are no rales or rhonchi.  Expansion of the chest is symmetrical.  The precordium is quiet.  The first heart sound is normal.  The second heart sound is physiologically split.  There is no murmur gallop rub or click.  There is no abnormal lift or heave.  The abdomen is soft and nontender.  The bowel sounds are normal.  The liver and spleen are not enlarged.  There are no abdominal masses.  There are no abdominal bruits.  Extremities reveal good pedal pulses.  There is no phlebitis or edema.  There is no cyanosis or clubbing.  Strength is normal and symmetrical in all extremities.  There is no lateralizing weakness.  There are no sensory deficits.  The skin is warm and dry.  There is no rash.    Impression: 1.  Moderate atherosclerotic coronary artery disease treated medically. 2.  History of frequent PVCs, resolved 3.  History of statin induced myopathy 4.  History of methotrexate toxicity and acute respiratory failure with hypoxemia treated at [facility] in July 2015 5.  Chronic diastolic heart failure responding to low-dose  diuretic  Disposition: Continue current medication.  Recheck in 6 months for office visit and EKG

## 2014-08-22 ENCOUNTER — Ambulatory Visit (INDEPENDENT_AMBULATORY_CARE_PROVIDER_SITE_OTHER): Payer: Medicare Other | Admitting: Critical Care Medicine

## 2014-08-22 ENCOUNTER — Encounter: Payer: Self-pay | Admitting: Critical Care Medicine

## 2014-08-22 VITALS — BP 134/70 | HR 72 | Temp 98.4°F | Ht 75.0 in | Wt 233.4 lb

## 2014-08-22 DIAGNOSIS — I259 Chronic ischemic heart disease, unspecified: Secondary | ICD-10-CM

## 2014-08-22 DIAGNOSIS — R918 Other nonspecific abnormal finding of lung field: Secondary | ICD-10-CM

## 2014-08-22 MED ORDER — PREDNISONE 10 MG PO TABS: 10.0000 mg | ORAL_TABLET | Freq: Every day | ORAL | Status: DC

## 2014-08-22 NOTE — Patient Instructions (Signed)
Return as needed

## 2014-08-22 NOTE — Progress Notes (Signed)
Subjective:    Patient ID: Austin Keller, male    DOB: 03/28/1946, 68 y.o.   MRN: [id number]  HPI  HPI Comments: Pt has been in hosp [facility] ICU:  7/26 until 03/24/14.  Pt was on bipap.  Pt there d/t pulm issues.  Pt was seen by [facility] Rheum: IV IgG infusion x 2 days. Pt worse with dyspnea, Pt admitted to [facility].  ?chemically induced pulm infection.   Pt with polymyositis dx : 11/2013.  [facility]. Symptoms are weakness in muscles.  Ck 14000 when adm [facility] 11/2013:  CK down to 9000. Later dx clarified as polymyositis    06/13/14  Follow up with PFT  Pt returns for 1 month follow up .  Seen last month for pulmonary consult for pulmonary infiltrates felt secondary to adverse gammaglobulin infusion . She has hx of necrotizing polymyositis .  Going to PT , strength is getting better.  Tapering prednisone slowly over next 2 months to 5mg  by 07/24/14 .  PFT today shows FEV1 75%, ratio 67 , FVC 83%, no sign BD response.  midflows decreased with BD response  Mildly decreased diffusing capacity at 69%.  Denies chest pain orthopnea, edema or fever.  Denies cough. Says shortness of breath is getting better.  Last cxr 04/2014 with mild peribronchial thickening and mid lung scarring.   08/22/2014 Chief Complaint  Patient presents with  . Follow-up    Pt states is doing well. He is even during well during his exercise sessions.  No real cough, no real dyspnea with exertion.  Muscles are better.  Goes to exercise class and doing well on this as well. Pt denies any significant sore throat, nasal congestion or excess secretions, fever, chills, sweats, unintended weight loss, pleurtic or exertional chest pain, orthopnea PND, or leg swelling Pt denies any increase in rescue therapy over baseline, denies waking up needing it or having any early am or nocturnal exacerbations of coughing/wheezing/or dyspnea. Pt also denies any obvious fluctuation in symptoms with  weather or environmental change or other  alleviating or aggravating factors    Review of Systems Constitutional:   No  weight loss, night sweats,  Fevers, chills,  +fatigue, or  lassitude.  HEENT:   No headaches,  Difficulty swallowing,  Tooth/dental problems, or  Sore throat,                No sneezing, itching, ear ache, nasal congestion, post nasal drip,   CV:  No chest pain,  Orthopnea, PND, swelling in lower extremities, anasarca, dizziness, palpitations, syncope.   GI  No heartburn, indigestion, abdominal pain, nausea, vomiting, diarrhea, change in bowel habits, loss of appetite, bloody stools.   Resp:  .  No chest wall deformity  Skin: no rash or lesions.  GU: no dysuria, change in color of urine, no urgency or frequency.  No flank pain, no hematuria   Psych:  No change in mood or affect. No depression or anxiety.  No memory loss.         Objective:   Physical Exam  BP 134/70 mmHg  Pulse 72  Temp(Src) 98.4 F (36.9 C) (Oral)  Ht 6\' 3"  (1.905 m)  Wt 233 lb 6.4 oz (105.87 kg)  BMI 29.17 kg/m2  SpO2 94%  GEN: A/Ox3; pleasant , NAD,  HEENT:  [place]/AT,  EACs-clear, TMs-wnl, NOSE-clear, THROAT-clear, no lesions, no postnasal drip or exudate noted.   NECK:  Supple w/ fair ROM; no JVD; normal carotid impulses  w/o bruits; no thyromegaly or nodules palpated; no lymphadenopathy.  RESP  Decreased BS in bases no accessory muscle use, no dullness to percussion  CARD:  RRR, no m/r/g  , no peripheral edema, pulses intact, no cyanosis or clubbing.  GI:   Soft & nt; nml bowel sounds; no organomegaly or masses detected.  Musco: Warm bil, no deformities or joint swelling noted.   Neuro: alert, no focal deficits noted.    Skin: Warm, no lesions or rashes          Assessment & Plan:   Pulmonary infiltrate Pulmonary infiltrate now resolved on current CT scan of chest No evidence of significant interstitial disease seen No additional pulmonary treatment or follow-up indicated Return as needed    Updated  Medication List Outpatient Encounter Prescriptions as of 08/22/2014  Medication Sig  . alendronate (FOSAMAX) 70 MG tablet once a week.  Marland Kitchen aspirin 81 MG tablet Take 81 mg by mouth daily.  . calcium-vitamin D (CALCIUM 500/D) 500-200 MG-UNIT per tablet Take 1 tablet by mouth 2 (two) times daily.  . cholecalciferol (VITAMIN D) 1000 UNITS tablet Take by mouth daily.   . folic acid (FOLVITE) 1 MG tablet Take 1 mg by mouth daily.  . furosemide (LASIX) 40 MG tablet Take 20 mg by mouth as directed. 1/2 tablet daily  . nitroGLYCERIN (NITROSTAT) 0.4 MG SL tablet Place 1 tablet (0.4 mg total) under the tongue every 5 (five) minutes as needed for chest pain.  . pantoprazole (PROTONIX) 20 MG tablet Take 20 mg by mouth daily.  . predniSONE (DELTASONE) 10 MG tablet Take 1 tablet (10 mg total) by mouth daily. 08/24/2014 pt to begin 5 mg every other day  . vitamin B-12 (CYANOCOBALAMIN) 1000 MCG tablet Take 1,000 mcg by mouth daily.  . [DISCONTINUED] predniSONE (DELTASONE) 20 MG tablet Take 10 mg by mouth daily. 08/24/2014 pt to begin 5 mg every other day

## 2014-08-23 NOTE — Assessment & Plan Note (Signed)
Pulmonary infiltrate now resolved on current CT scan of chest No evidence of significant interstitial disease seen No additional pulmonary treatment or follow-up indicated Return as needed

## 2014-09-17 ENCOUNTER — Encounter: Payer: Self-pay | Admitting: Critical Care Medicine

## 2014-09-17 MED ORDER — AZITHROMYCIN 250 MG PO TABS: ORAL_TABLET | ORAL | Status: AC

## 2014-09-17 NOTE — Telephone Encounter (Signed)
PW please advise. Thanks! 

## 2014-12-10 ENCOUNTER — Encounter: Payer: Self-pay | Admitting: Cardiology

## 2014-12-10 ENCOUNTER — Encounter: Payer: Self-pay | Admitting: Internal Medicine

## 2015-01-15 ENCOUNTER — Encounter: Payer: Self-pay | Admitting: Critical Care Medicine

## 2015-01-15 ENCOUNTER — Ambulatory Visit: Payer: Medicare Other | Admitting: Cardiology

## 2015-01-15 NOTE — Telephone Encounter (Signed)
PW - please advise. You wanted pt to follow up PRN. Thanks.

## 2015-01-31 ENCOUNTER — Encounter: Payer: Self-pay | Admitting: Cardiology

## 2015-01-31 ENCOUNTER — Ambulatory Visit (INDEPENDENT_AMBULATORY_CARE_PROVIDER_SITE_OTHER): Payer: Medicare Other | Admitting: Cardiology

## 2015-01-31 VITALS — BP 144/70 | HR 66 | Ht 75.0 in | Wt 236.4 lb

## 2015-01-31 DIAGNOSIS — I2510 Atherosclerotic heart disease of native coronary artery without angina pectoris: Secondary | ICD-10-CM

## 2015-01-31 DIAGNOSIS — E7800 Pure hypercholesterolemia, unspecified: Secondary | ICD-10-CM

## 2015-01-31 DIAGNOSIS — I2583 Coronary atherosclerosis due to lipid rich plaque: Secondary | ICD-10-CM

## 2015-01-31 DIAGNOSIS — I5032 Chronic diastolic (congestive) heart failure: Secondary | ICD-10-CM | POA: Diagnosis not present

## 2015-01-31 DIAGNOSIS — I259 Chronic ischemic heart disease, unspecified: Secondary | ICD-10-CM | POA: Diagnosis not present

## 2015-01-31 LAB — BASIC METABOLIC PANEL
BUN: 16 mg/dL (ref 6–23)
CALCIUM: 9.8 mg/dL (ref 8.4–10.5)
CHLORIDE: 101 meq/L (ref 96–112)
CO2: 28 mEq/L (ref 19–32)
Creatinine, Ser: 0.91 mg/dL (ref 0.40–1.50)
GFR: 87.32 mL/min (ref >60.00)
GLUCOSE: 69 mg/dL — AB (ref 70–99)
POTASSIUM: 3.8 meq/L (ref 3.5–5.1)
Sodium: 137 mEq/L (ref 135–145)

## 2015-01-31 LAB — HEPATIC FUNCTION PANEL
ALBUMIN: 4.3 g/dL (ref 3.5–5.2)
ALT: 57 U/L — ABNORMAL HIGH (ref 0–53)
AST: 43 U/L — ABNORMAL HIGH (ref 0–37)
Alkaline Phosphatase: 48 U/L (ref 39–117)
BILIRUBIN DIRECT: 0.1 mg/dL (ref 0.0–0.3)
Total Bilirubin: 0.7 mg/dL (ref 0.2–1.2)
Total Protein: 7.7 g/dL (ref 6.0–8.3)

## 2015-01-31 LAB — LIPID PANEL
CHOLESTEROL: 252 mg/dL — AB (ref 0–200)
HDL: 72 mg/dL (ref >39.00)
NonHDL: 180
TRIGLYCERIDES: 261 mg/dL — AB (ref 0.0–149.0)
Total CHOL/HDL Ratio: 4
VLDL: 52.2 mg/dL — ABNORMAL HIGH (ref 0.0–40.0)

## 2015-01-31 LAB — LDL CHOLESTEROL, DIRECT: LDL DIRECT: 144 mg/dL

## 2015-01-31 NOTE — Progress Notes (Signed)
Cardiology Office Note   Date:  01/31/2015   ID:  Austin Keller, DOB Dec 25, 1945, MRN [id number]  PCP:  Drema Pry, DO  Cardiologist: Darlin Coco MD  No chief complaint on file.     History of Present Illness: Austin Keller is a 69 y.o. male who presents for scheduled 6 month follow-up office visit.  This pleasant 69 year old gentleman is seen for a scheduled followup office visit. We saw him initially in January 2013 for preoperative cardiac clearance for surgery on his left knee. At the time of his preoperative EKG had a lot of PVCs. He was noted to have multiple risk factors for premature coronary disease. He had an abnormal stress test which led to a cardiac catheterization. His catheterization showed moderate LAD disease without obstruction and he had normal LV function. He went ahead and had successful surgery by Dr. Daylene Katayama. The patient reports that in March 2015 he was hospitalized for severe myalgias. 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 He is now off statins.. The patient reports that in July 2015 at [facility] rheumatology he was given an infusion of intravenous immunoglobulin. Several days later his breathing became markedly worse and he was airlifted back to [facility]. Review of outside records indicates a diagnosis of methotrexate toxicity and acute respiratory failure with hypoxemia. Patient states that he was in the intensive care unit for 6 days. Echocardiogram done at [facility] on 03/19/14 showed normal systolic function with an ejection fraction of 55-60% and there was diastolic dysfunction. Since we last saw him he has been feeling better.  His leg strength has returned to normal.  His CK levels have returned to normal.  He has just finished a course of prednisone.  He has been having easy bruising related to the prednisone and the chronic aspirin therapy.  From a cardiac standpoint he is not having any angina  pectoris.  He does have a history of coronary disease and is currently not on any lipid-lowering agents.  Past Medical History  Diagnosis Date  . Hyperlipidemia   . Dupuytren's contracture of hand 08/2011    left  . Speech disorder   . Hypertension     under control; has been on med. x 2-3 yrs.  Marland Kitchen CAD (coronary artery disease) 10/02/11    Moderate LAD disease with calcification. No obstructive CAD. LV function was normal.   . Asthma     as a child  . Shortness of breath   . Rhabdomyolysis     statin    Past Surgical History  Procedure Laterality Date  . Tonsillectomy      AS CHILD  . Hand surgery      left  . Colonoscopy    . Cardiac catheterization  10/02/2011    Moderate LAD disease with calcification. No obstructive CAD. Normal LV function.  . Dupuytren contracture release  11/10/2011    Procedure: DUPUYTREN CONTRACTURE RELEASE;  Surgeon: Cammie Sickle., MD;  Location: [facility];  Service: Orthopedics;  Laterality: Left;  palm, long, ring, small, thumb and index web left hand   . Muscle biopsy Right 12/22/2013    Procedure: MUSCLE BIOPSY RIGHT LOWER EXTREMITY ;  Surgeon: Ralene Ok, MD;  Location: [facility];  Service: General;  Laterality: Right;     Current Outpatient Prescriptions  Medication Sig Dispense Refill  . alendronate (FOSAMAX) 70 MG tablet once a week.    Marland Kitchen  aspirin 81 MG tablet Take 81 mg by mouth daily.    . calcium-vitamin D (CALCIUM 500/D) 500-200 MG-UNIT per tablet Take 1 tablet by mouth 2 (two) times daily.    . cholecalciferol (VITAMIN D) 1000 UNITS tablet Take 1,000 Units by mouth daily.     . folic acid (FOLVITE) 1 MG tablet Take 1 mg by mouth daily.    . furosemide (LASIX) 40 MG tablet Take 20 mg by mouth as directed. 1/2 tablet daily    . nitroGLYCERIN (NITROSTAT) 0.4 MG SL tablet Place 1 tablet (0.4 mg total) under the tongue every 5 (five) minutes as needed for chest pain. 25 tablet 3  . vitamin B-12 (CYANOCOBALAMIN) 1000 MCG  tablet Take 1,000 mcg by mouth daily.     No current facility-administered medications for this visit.    Allergies:   Statins    Social History:  The patient  reports that he quit smoking about 18 years ago. His smoking use included Cigarettes. He has a 30 pack-year smoking history. He has never used smokeless tobacco. He reports that he drinks about 10.5 oz of alcohol per week. He reports that he does not use illicit drugs.   Family History:  The patient's family history includes Hip fracture in his mother; Ulcers in his father.    ROS:  Please see the history of present illness.   Otherwise, review of systems are positive for none.   All other systems are reviewed and negative.    PHYSICAL EXAM: VS:  BP 144/70 mmHg  Pulse 66  Ht 6\' 3"  (1.905 m)  Wt 236 lb 6.4 oz (107.23 kg)  BMI 29.55 kg/m2 , BMI Body mass index is 29.55 kg/(m^2). GEN: Well nourished, well developed, in no acute distress HEENT: normal Neck: no JVD, carotid bruits, or masses Cardiac: RRR; no murmurs, rubs, or gallops,no edema  Respiratory:  clear to auscultation bilaterally, normal work of breathing GI: soft, nontender, nondistended, + BS MS: no deformity or atrophy Skin: warm and dry, no rash Neuro:  Strength and sensation are intact Psych: euthymic mood, full affect   EKG:  EKG is ordered today. The ekg ordered today demonstrates normal sinus rhythm.  No ischemic changes.  Since prior tracing of 12/21/13, no significant change   Recent Labs: 04/25/2014: ALT 15; BUN 26*; Creatinine, Ser 0.9; Magnesium 2.2; Potassium 4.7; Sodium 139    Lipid Panel    Component Value Date/Time   CHOL 149 03/23/2013 0825   TRIG 88.0 03/23/2013 0825   HDL 66.30 03/23/2013 0825   CHOLHDL 2 03/23/2013 0825   VLDL 17.6 03/23/2013 0825   LDLCALC 65 03/23/2013 0825   LDLDIRECT 100.7 08/21/2011 0753      Wt Readings from Last 3 Encounters:  01/31/15 236 lb 6.4 oz (107.23 kg)  08/22/14 233 lb 6.4 oz (105.87 kg)    07/18/14 226 lb (102.513 kg)       ASSESSMENT AND PLAN:  1. Moderate atherosclerotic coronary artery disease treated medically. 2. History of frequent PVCs, resolved 3. History of statin induced myopathy 4. History of methotrexate toxicity and acute respiratory failure with hypoxemia treated at [facility] in July 2015 5. Chronic diastolic heart failure responding to low-dose diuretic  Disposition: Continue current medication. Recheck in 6 months for office visit and EKG   Current medicines are reviewed at length with the patient today.  The patient does not have concerns regarding medicines.  The following changes have been made:  no change  Labs/ tests  ordered today include:   Orders Placed This Encounter  Procedures  . Lipid panel  . Hepatic function panel  . Basic metabolic panel  . EKG 12-Lead     Disposition: We will obtain lipid panel hepatic function panel and basal metabolic panel today.  There are no lipids in the chart since 2014.  He is unable to take any statins.  We may consider ezetimibe if his LDL returns high. Continue other medicines the same and recheck in 6 months for follow-up office visit.  Berna Spare MD 01/31/2015 12:51 PM    [facility] [address], [place], [place]  [address] Phone: [phone]; Fax: [phone]

## 2015-01-31 NOTE — Patient Instructions (Signed)
Medication Instructions:  Your physician recommends that you continue on your current medications as directed. Please refer to the Current Medication list given to you today.  Labwork: Lp/bmet/hfp  Testing/Procedures: none  Follow-Up: Your physician wants you to follow-up in: 6 month ov You will receive a reminder letter in the mail two months in advance. If you don't receive a letter, please call our office to schedule the follow-up appointment.

## 2015-03-25 IMAGING — CR DG CHEST 2V
2 series · 2 of 2 positions shown · non-contrast
Comparison: Chest x-ray of 09/21/2011

CLINICAL DATA: Cough and chest pressure for 2 weeks, former smoking
history

CHEST - 2 VIEW

[view not recorded (1 of 2)]
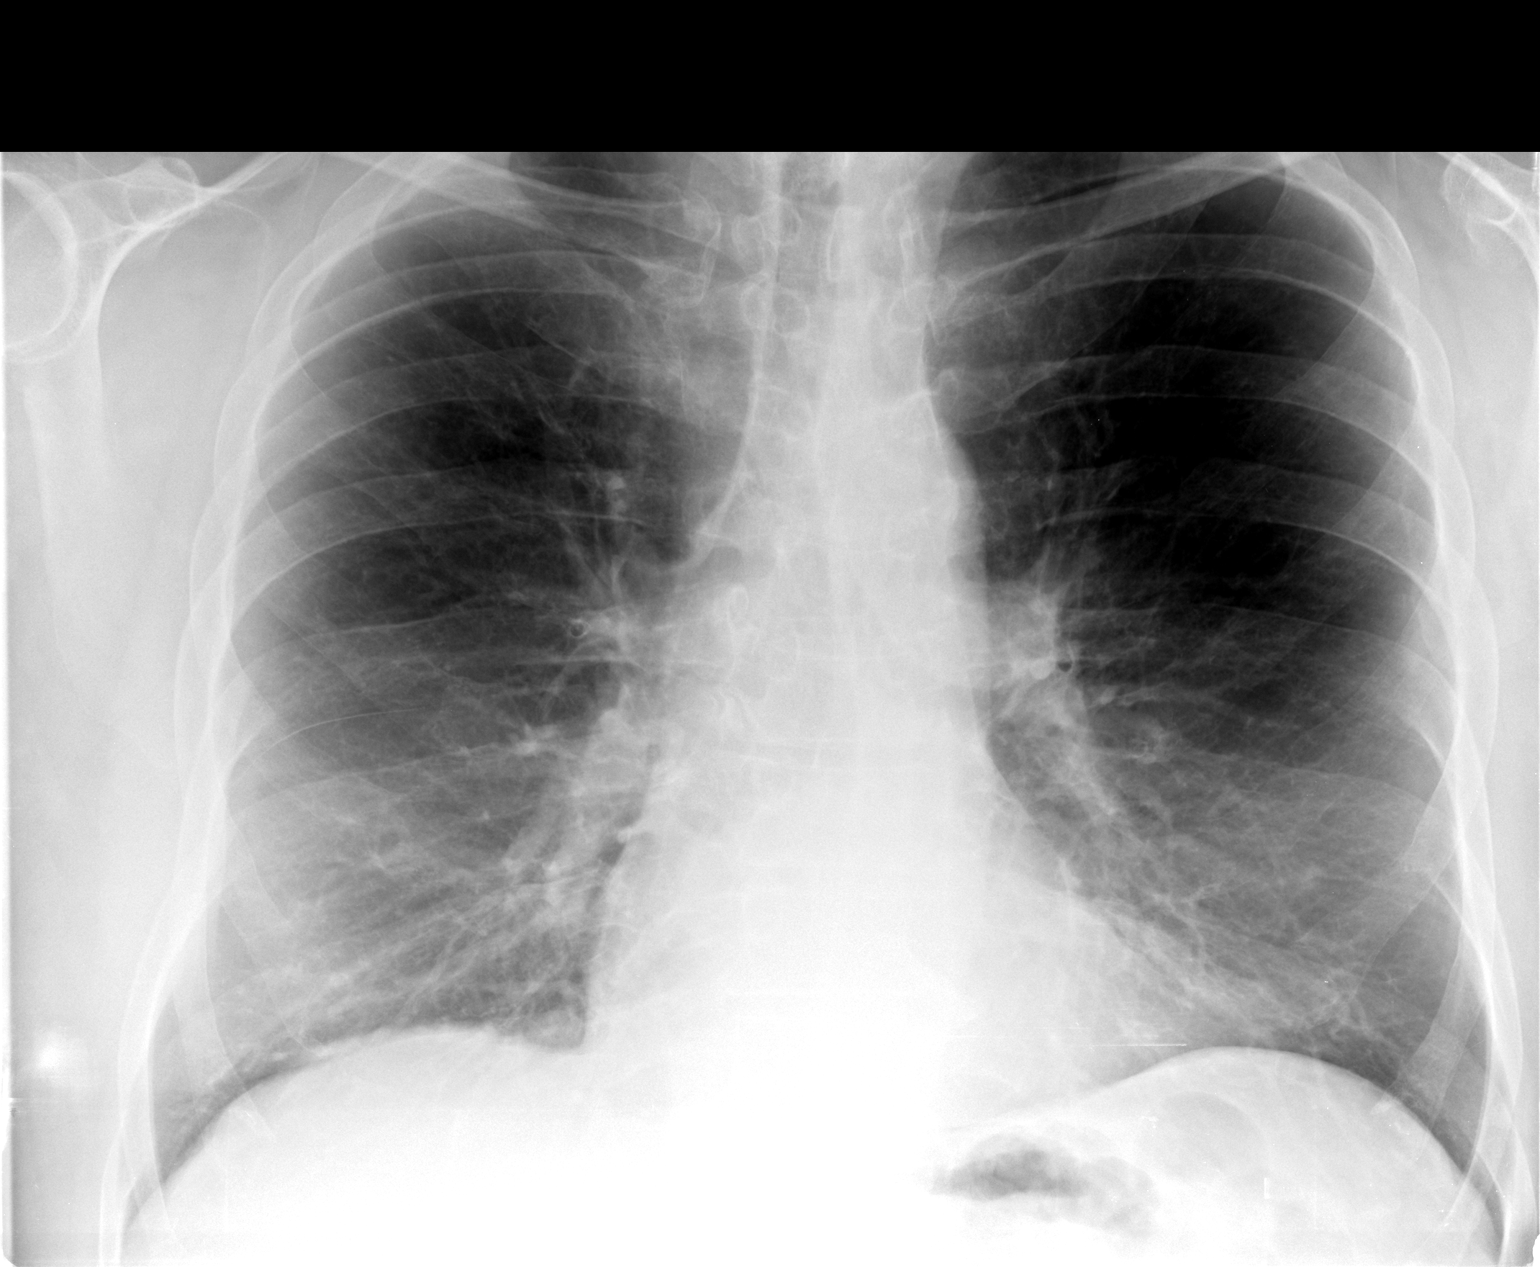

[view not recorded (2 of 2)]
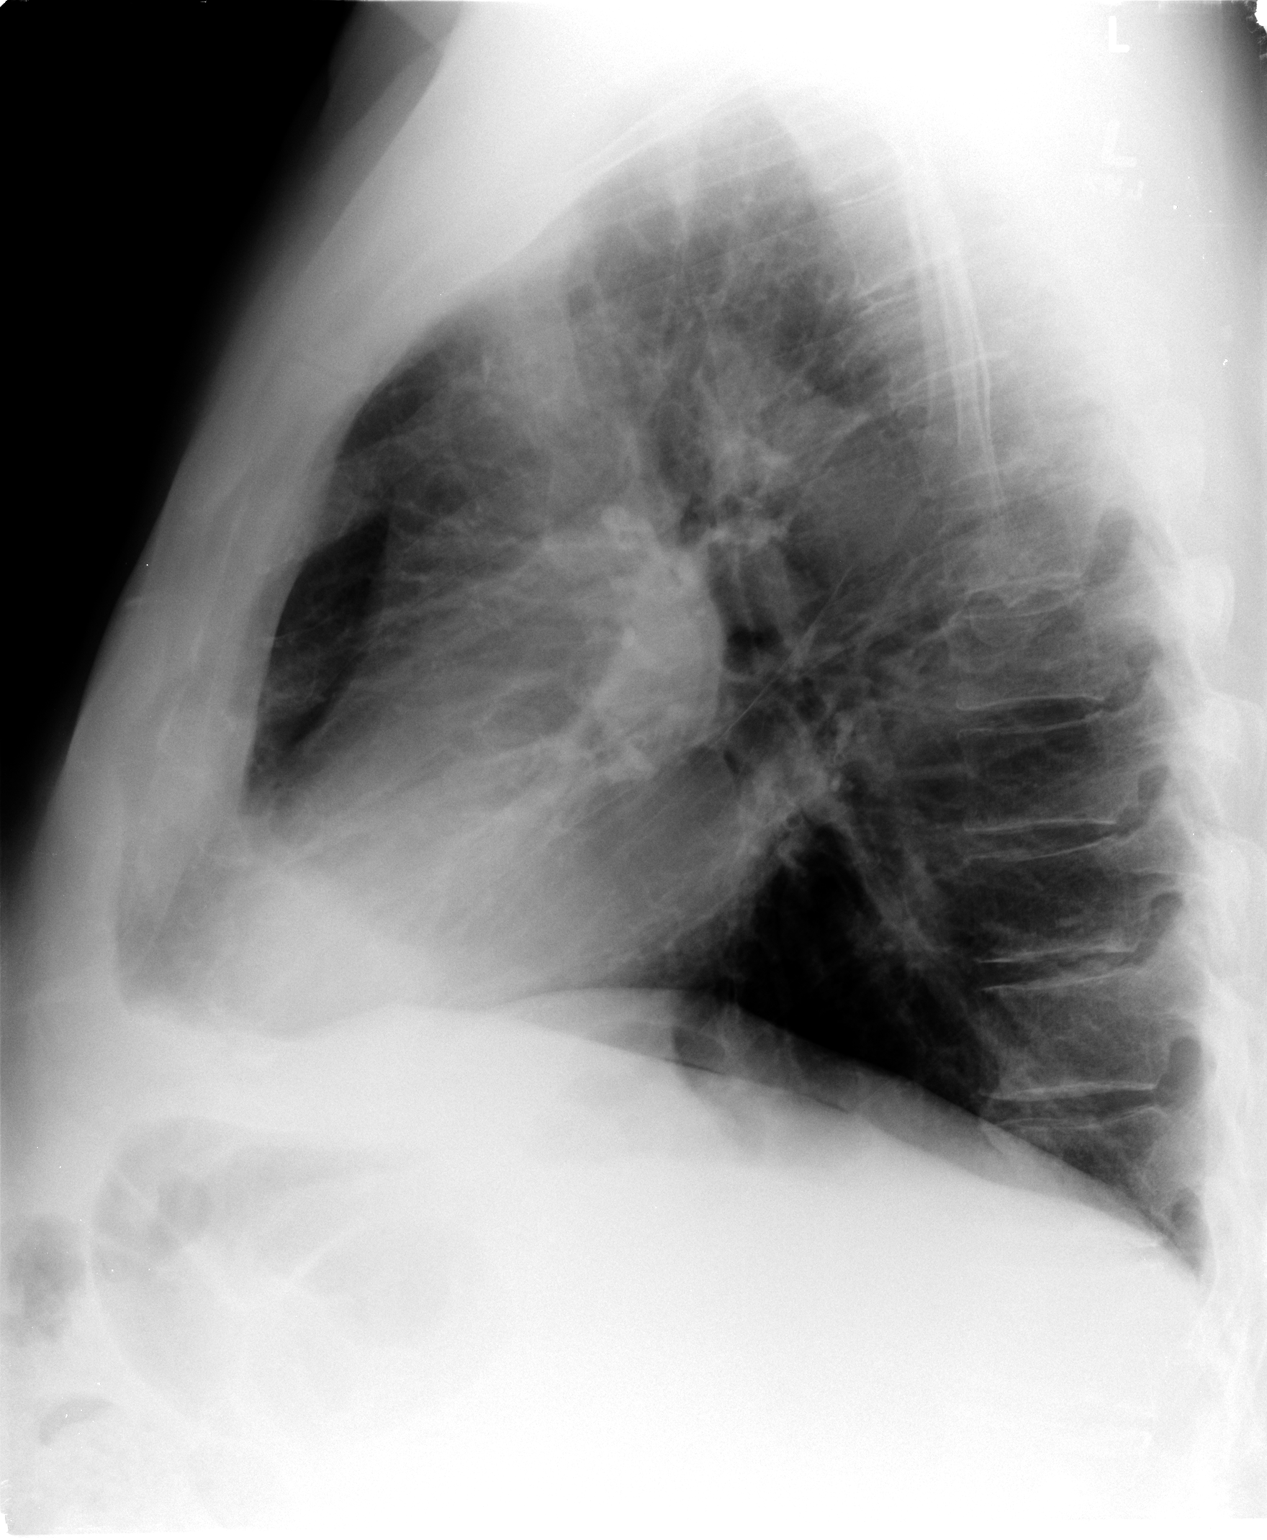

[2 of 2 positions shown; findings below may reference images not displayed]

FINDINGS: The lungs are clear and slightly hyperaerated.  No
infiltrate or effusion is seen.  There is mild peribronchial
thickening which can be seen with bronchitis.  Mediastinal contours
are stable and the heart is within normal limits in size. There
appears to be a calcification overlying the soft tissues of the
right lower chest of doubtful significance.  No bony abnormality is
seen.
IMPRESSION: No active lung disease.  Question bronchitis.

## 2015-06-19 NOTE — Patient Outreach (Signed)
[place] [facility]) Care Management  06/19/2015  Austin Keller 01-03-1946 [id number]   Referral from [place] 2 List, assigned Candie Mile, RN to outreach for [place] Management services.  Thanks, [name]. [place], [place] Assistant Phone: [phone] Fax: [phone]

## 2015-06-26 ENCOUNTER — Other Ambulatory Visit: Payer: Self-pay

## 2015-06-26 NOTE — Patient Outreach (Signed)
[place] [facility]) Care Management  06/26/2015  CAM DAUPHIN 1946-07-24 [id number]   Unsuccessful attempt to reach patient referred from [place] appropriate message left requesting call back.  RN Health Coach will attempt to reach patient within one week.  Candie Mile, RN, MSN [place] [phone] Fax [phone]

## 2015-07-02 ENCOUNTER — Other Ambulatory Visit: Payer: Self-pay

## 2015-07-02 NOTE — Patient Outreach (Signed)
[place] [facility]) Care Management  07/02/2015  BRAXON SUDER 1946-01-04 [id number]   Telephone call to patient past referral from [facility] 2 List.  Patient has a history of heart disease, and sees Dr. Mare Ferrari every 6 months.  Has not seen PCP Dr. Shawna Orleans recently, and has transferred to Dr. Larose Kells, whose office is closer to him.  Explanation of [facility] services provided.  Patient expressed interest in services, but stated he and his wife have bought a house in [place], [place] and are in the process of moving.  Discussed with him the need to find a new medical home once he gets to [facility].  Encouraged him to contact Dr. Larose Kells or Dr. Shawna Orleans and request a transfer to a PCP in that area.  He stated he would do that.  Patient reports he has not yet received a flu vaccination this year.  Encouraged him to do so as soon as possible, before he moves.  Education provided re increased risks of flu with age and when you have chronic illnesses.  Also recommended he avoid contact with anyone who is contagious.   Candie Mile, RN, MSN [place] [phone] Fax [phone]

## 2015-07-03 NOTE — Patient Outreach (Signed)
[place] [facility]) Care Management  07/03/2015  Austin Keller 1945/11/29 [id number]   Notification from Candie Mile, RN to close case for [place] Management as patient is moving to moving to [place], [place].  Thanks, [name]. [place], [place] Assistant Phone: [phone] Fax: [phone]

## 2015-07-04 ENCOUNTER — Ambulatory Visit (INDEPENDENT_AMBULATORY_CARE_PROVIDER_SITE_OTHER): Payer: Medicare Other | Admitting: Behavioral Health

## 2015-07-04 DIAGNOSIS — Z23 Encounter for immunization: Secondary | ICD-10-CM | POA: Diagnosis not present

## 2015-07-04 NOTE — Progress Notes (Signed)
Pre visit review using our clinic review tool, if applicable. No additional management support is needed unless otherwise documented below in the visit note. 

## 2015-07-16 ENCOUNTER — Encounter: Payer: Self-pay | Admitting: Internal Medicine

## 2015-08-01 ENCOUNTER — Ambulatory Visit: Payer: Medicare Other | Admitting: Cardiology

## 2015-08-21 ENCOUNTER — Encounter: Payer: Self-pay | Admitting: *Deleted

## 2017-12-29 ENCOUNTER — Encounter: Payer: Self-pay | Admitting: Internal Medicine
